# Patient Record
Sex: Female | Born: 1975 | Race: White | Hispanic: No | Smoking: Former smoker
Health system: Southern US, Community
[De-identification: ages and names within clinical notes are randomized; demographics above are authoritative.]

## PROBLEM LIST (undated history)

## (undated) ENCOUNTER — Inpatient Hospital Stay (HOSPITAL_COMMUNITY): Payer: Self-pay

## (undated) DIAGNOSIS — E7800 Pure hypercholesterolemia, unspecified: Secondary | ICD-10-CM

## (undated) DIAGNOSIS — G43909 Migraine, unspecified, not intractable, without status migrainosus: Secondary | ICD-10-CM

## (undated) DIAGNOSIS — E559 Vitamin D deficiency, unspecified: Secondary | ICD-10-CM

## (undated) DIAGNOSIS — O009 Unspecified ectopic pregnancy without intrauterine pregnancy: Secondary | ICD-10-CM

## (undated) DIAGNOSIS — R87629 Unspecified abnormal cytological findings in specimens from vagina: Secondary | ICD-10-CM

## (undated) DIAGNOSIS — E663 Overweight: Secondary | ICD-10-CM

## (undated) DIAGNOSIS — F329 Major depressive disorder, single episode, unspecified: Secondary | ICD-10-CM

## (undated) HISTORY — DX: Unspecified abnormal cytological findings in specimens from vagina: R87.629

## (undated) HISTORY — DX: Major depressive disorder, single episode, unspecified: F32.9

## (undated) HISTORY — PX: LEEP: SHX91

## (undated) HISTORY — DX: Migraine, unspecified, not intractable, without status migrainosus: G43.909

## (undated) HISTORY — DX: Unspecified ectopic pregnancy without intrauterine pregnancy: O00.90

## (undated) HISTORY — DX: Pure hypercholesterolemia, unspecified: E78.00

## (undated) HISTORY — PX: WISDOM TOOTH EXTRACTION: SHX21

## (undated) HISTORY — DX: Vitamin D deficiency, unspecified: E55.9

## (undated) HISTORY — DX: Overweight: E66.3

---

## 1998-05-12 DIAGNOSIS — O009 Unspecified ectopic pregnancy without intrauterine pregnancy: Secondary | ICD-10-CM

## 1998-05-12 HISTORY — DX: Unspecified ectopic pregnancy without intrauterine pregnancy: O00.90

## 1998-05-12 HISTORY — PX: LAPAROSCOPY: SHX197

## 2005-05-12 DIAGNOSIS — F32A Depression, unspecified: Secondary | ICD-10-CM

## 2005-05-12 HISTORY — DX: Depression, unspecified: F32.A

## 2005-09-24 ENCOUNTER — Inpatient Hospital Stay: Payer: Self-pay

## 2008-03-29 ENCOUNTER — Encounter: Admission: RE | Admit: 2008-03-29 | Discharge: 2008-03-29 | Payer: Self-pay | Admitting: Family Medicine

## 2009-07-13 ENCOUNTER — Ambulatory Visit (HOSPITAL_COMMUNITY): Admission: RE | Admit: 2009-07-13 | Discharge: 2009-07-13 | Payer: Self-pay | Admitting: Obstetrics and Gynecology

## 2010-01-27 ENCOUNTER — Inpatient Hospital Stay (HOSPITAL_COMMUNITY): Admission: AD | Admit: 2010-01-27 | Discharge: 2010-01-27 | Payer: Self-pay | Admitting: Obstetrics & Gynecology

## 2010-01-27 ENCOUNTER — Ambulatory Visit: Payer: Self-pay | Admitting: Nurse Practitioner

## 2010-02-27 ENCOUNTER — Inpatient Hospital Stay (HOSPITAL_COMMUNITY): Admission: AD | Admit: 2010-02-27 | Discharge: 2010-03-02 | Payer: Self-pay | Admitting: Obstetrics and Gynecology

## 2010-07-24 LAB — GLUCOSE, CAPILLARY: Glucose-Capillary: 50 mg/dL — ABNORMAL LOW (ref 70–99)

## 2010-07-24 LAB — CBC
HCT: 34.8 % — ABNORMAL LOW (ref 36.0–46.0)
Hemoglobin: 11.1 g/dL — ABNORMAL LOW (ref 12.0–15.0)
Hemoglobin: 12 g/dL (ref 12.0–15.0)
MCV: 94.5 fL (ref 78.0–100.0)
Platelets: 157 10*3/uL (ref 150–400)

## 2010-07-25 LAB — URINALYSIS, ROUTINE W REFLEX MICROSCOPIC
Bilirubin Urine: NEGATIVE
Glucose, UA: NEGATIVE mg/dL
Ketones, ur: NEGATIVE mg/dL
Protein, ur: NEGATIVE mg/dL
pH: 5.5 (ref 5.0–8.0)

## 2011-04-22 ENCOUNTER — Encounter: Payer: Self-pay | Admitting: *Deleted

## 2011-04-22 ENCOUNTER — Other Ambulatory Visit: Payer: Self-pay

## 2011-04-22 ENCOUNTER — Emergency Department (HOSPITAL_COMMUNITY)
Admission: EM | Admit: 2011-04-22 | Discharge: 2011-04-22 | Disposition: A | Discharge status: Home / Self Care | Payer: BC Managed Care – PPO | Attending: Emergency Medicine | Admitting: Emergency Medicine

## 2011-04-22 DIAGNOSIS — Z79899 Other long term (current) drug therapy: Secondary | ICD-10-CM | POA: Insufficient documentation

## 2011-04-22 DIAGNOSIS — R4182 Altered mental status, unspecified: Secondary | ICD-10-CM | POA: Insufficient documentation

## 2011-04-22 DIAGNOSIS — F3289 Other specified depressive episodes: Secondary | ICD-10-CM | POA: Insufficient documentation

## 2011-04-22 DIAGNOSIS — T50905A Adverse effect of unspecified drugs, medicaments and biological substances, initial encounter: Secondary | ICD-10-CM

## 2011-04-22 DIAGNOSIS — F329 Major depressive disorder, single episode, unspecified: Secondary | ICD-10-CM | POA: Insufficient documentation

## 2011-04-22 DIAGNOSIS — T483X5A Adverse effect of antitussives, initial encounter: Secondary | ICD-10-CM | POA: Insufficient documentation

## 2011-04-22 DIAGNOSIS — R42 Dizziness and giddiness: Secondary | ICD-10-CM | POA: Insufficient documentation

## 2011-04-22 LAB — POCT I-STAT, CHEM 8
BUN: 18 mg/dL (ref 6–23)
Creatinine, Ser: 0.6 mg/dL (ref 0.50–1.10)
Hemoglobin: 12.6 g/dL (ref 12.0–15.0)
TCO2: 25 mmol/L (ref 0–100)

## 2011-04-22 NOTE — ED Provider Notes (Signed)
I saw and evaluated the patient, reviewed the resident's note and I agree with the findings and plan. 35 year old, female, with no significant past medical history presents emergency department complaining of a vague feeling of visual changes, and inability to use her left arm, while she was driving.  The symptoms lasted for about 30 minutes and have resolved.  She denied blurriness of her vision.  She denied nausea, vomiting, headache, weakness, palpitations of her heart or chest pain.  She's never had this before.  She states that she is taking Delsym for a URI.  Right.  Now.  She also takes Cymbalta.  She does not smoke and has not had any history of coronary disease or neurological disease in the past.  Her physical examination is completely normal.  At this time.  There is no indication for CAT scan or an MRI of her head.  We will let her go home with her husband.  She and her husband are both comfortable with this plan.  Nicholes Stairs, MD 04/22/11 1949

## 2011-04-22 NOTE — ED Provider Notes (Signed)
History     CSN: [id number] Arrival date & time: 04/22/2011  3:46 PM   First MD Initiated Contact with Patient 04/22/11 1614      Chief Complaint  Patient presents with  . Altered Mental Status    (Consider location/radiation/quality/duration/timing/severity/associated sxs/prior treatment) The history is provided by the patient.   Patient is a 78 seminal female with history of migraine and postpartum depression who presents with altered mental status. This started about an hour prior to arrival, lasted 30 minutes, was constant, and resolved prior to arrival. Patient states that she was driving when she had this feeling of her head being "full" and having trouble communicating from her head to her arms and legs. During this time she still had normal strength and sensation in all extremities. She was also able to drive and pull off the road.  When she stopped the car, she got out and as she stood up she felt lightheaded. No syncope. Lightheadedness resolved when she sat down. Patient denies headache, vision changes, eye pain, vision loss, dizziness, palpitations, chest pain, dyspnea, abdominal discomfort, dysuria. She is on her menstrual cycle at this time but is not changed in quality. She has not had this same problem before. She describes her migraines as typically left-sided headache and after her menstrual cycle. Patient did take dextromethorphan about an hour and half prior to this complaint. She took this for a mild URI symptoms she's had for the last week. No recorded fever.       Past Medical History  Diagnosis Date  . Depressed     No past surgical history on file.  No family history on file.  History  Substance Use Topics  . Smoking status: Not on file  . Smokeless tobacco: Not on file  . Alcohol Use:     OB History    Grav Para Term Preterm Abortions TAB SAB Ect Mult Living                  Review of Systems  Constitutional: Negative for fever and chills.    HENT: Negative for ear pain, facial swelling and neck stiffness.        No neck pain  Eyes: Negative for visual disturbance.  Respiratory: Negative for cough, chest tightness, shortness of breath and wheezing.   Cardiovascular: Negative for chest pain.  Gastrointestinal: Negative for nausea, vomiting, abdominal pain and diarrhea.  Genitourinary: Negative for difficulty urinating.  Skin: Negative for rash.  Neurological: Negative for seizures, syncope, speech difficulty, weakness and numbness.  Psychiatric/Behavioral: Negative for behavioral problems and confusion.       Mood has been good, no depression  All other systems reviewed and are negative.    Allergies  Penicillins and Sulfa antibiotics  Home Medications   Current Outpatient Rx  Name Route Sig Dispense Refill  . DULOXETINE HCL 60 MG PO CPEP Oral Take 60 mg by mouth daily.        BP 104/69  Pulse 84  Temp(Src) 97.4 F (36.3 C) (Oral)  Resp 16  SpO2 99%  Physical Exam  Nursing note and vitals reviewed. Constitutional: She is oriented to person, place, and time. She appears well-developed and well-nourished. No distress.  HENT:  Head: Normocephalic.  Mouth/Throat: Oropharynx is clear and moist.  Eyes: EOM are normal. Pupils are equal, round, and reactive to light. No scleral icterus.  Neck: Normal range of motion. Neck supple.       No bruits  Cardiovascular: Normal rate, regular  rhythm and intact distal pulses.   Pulmonary/Chest: Effort normal. No respiratory distress. She has no wheezes. She has no rales.  Abdominal: Soft. She exhibits no distension. There is no tenderness.  Musculoskeletal: Normal range of motion. She exhibits no edema and no tenderness.  Neurological: She is alert and oriented to person, place, and time. She has normal strength. No cranial nerve deficit or sensory deficit. She exhibits normal muscle tone. She displays a negative Romberg sign. Coordination and gait normal. GCS eye subscore is  4. GCS verbal subscore is 5. GCS motor subscore is 6.       No pronator drift  Skin: Skin is warm and dry. No rash noted. She is not diaphoretic.  Psychiatric: She has a normal mood and affect. Her behavior is normal. Thought content normal.    ED Course  Procedures (including critical care time)   Labs Reviewed  POCT I-STAT, CHEM 8  POCT PREGNANCY, URINE   No results found.   1. Adverse effects of medication       MDM   Patient here with transient altered mental status. It was resolved upon arrival to emergency department. She had no focal neurologic deficits during this time. Also no syncope or seizure-like activity. Patient has history of migraines though they're typically very different than this. Based on her new taking of dextromethorphan, patient's complaints most likely secondary to adverse effect of medication. In addition to her they confusion she had dilated pupils on arrival which improved as her symptoms improve. Full neurologic exam was unremarkable. Electrolytes also unremarkable. Nothing in history or exam to suggest emergent intracranial etiology.  Patient does take Cymbalta but there are no vital sign or clinical features that would raise concern for serotonin syndrome. Patient has good followup and return precautions were discussed.       Milus Glazier 04/23/11 0052  Milus Glazier 04/23/11 [phone]

## 2011-04-22 NOTE — ED Notes (Signed)
Per EMS pt from home with c/o confusion, visual disturbances. No facial droop, grips equal, no slurred speech. Pt alert and oriented. Unsteady on feet. Pt c/o pressure in head PTA, denies pain now.

## 2011-04-22 NOTE — ED Notes (Signed)
Pt states she is currently feeling normal.  The episode of vision disturbance and feeling odd/weak came on gradually and went away gradually.  Pt didn't feel like she was passing out, but felt strange.  Episode lasted approximately 30 minutes

## 2011-04-22 NOTE — ED Notes (Signed)
Pt states she was driving and muscles would not respond to her thoughts.  States her breathing and pulse were normal but thoughts and actions werent together.  Pt states she had pressure in her head and pulled her car over. No pain now and no anxiety

## 2011-04-23 NOTE — ED Provider Notes (Signed)
I saw and evaluated the patient, reviewed the resident's note and I agree with the findings and plan.  Nicholes Stairs, MD 04/23/11 [phone]

## 2012-06-16 ENCOUNTER — Encounter: Payer: Self-pay | Admitting: Family Medicine

## 2012-06-16 ENCOUNTER — Ambulatory Visit (INDEPENDENT_AMBULATORY_CARE_PROVIDER_SITE_OTHER): Payer: BC Managed Care – PPO | Admitting: Family Medicine

## 2012-06-16 VITALS — BP 110/72 | HR 88 | Ht 64.0 in | Wt 155.0 lb

## 2012-06-16 DIAGNOSIS — F3289 Other specified depressive episodes: Secondary | ICD-10-CM

## 2012-06-16 DIAGNOSIS — Z6825 Body mass index (BMI) 25.0-25.9, adult: Secondary | ICD-10-CM

## 2012-06-16 DIAGNOSIS — F331 Major depressive disorder, recurrent, moderate: Secondary | ICD-10-CM | POA: Insufficient documentation

## 2012-06-16 DIAGNOSIS — F329 Major depressive disorder, single episode, unspecified: Secondary | ICD-10-CM

## 2012-06-16 DIAGNOSIS — E663 Overweight: Secondary | ICD-10-CM

## 2012-06-16 MED ORDER — DULOXETINE HCL 60 MG PO CPEP: 60.0000 mg | ORAL_CAPSULE | Freq: Every day | ORAL | Status: DC

## 2012-06-16 MED ORDER — PHENTERMINE HCL 37.5 MG PO CAPS: 37.5000 mg | ORAL_CAPSULE | ORAL | Status: DC

## 2012-06-16 NOTE — Progress Notes (Signed)
Chief Complaint  Patient presents with  . Establish Care    new patient to establish care and get a refill om Cymbalta. Also would like to discuss her weight.   HPI:  Megan Butler is a 37 year old female, well known to me (former colleague, never her physician) who presents to establish care.  She is requesting refills of Cymbalta, and requesting trial of Phentermine.    Depression: She was initially diagnosed with postpartum depression after 2nd trial.  Went off meds when pregnant with her third, and restarted meds shortly after his birth.  She went off for a short bit, and irritability recurred, so went back on it.  Denies side effects.  She initially had anorgasmia, no issues now.  Also took Lexapro in past (initially), caused significant fatigue.  She is also here to discuss her weight, requesting Phentermine.  She is a Artist, and has been keeping journal, food log.  She reports that her weight was 130's last year this time, has recently gained weight. "I have no control over my cravings".  She is having trouble in the afternoons, usually when she is out and about. Starts out as hunger, but then can't stop--realizes she is continuing to eat even when not hungry.  She denies any thyroid-related symptoms.  She was thinking of 1200kcal goal, and has been trying this recently.  She reports that when she is eating 1600-1700 cals, she does fine, less hungry, but is concerned that she won't lose the weight.  She has a FitBit, and gets 10K steps daily, elliptical 30 min 3-4 days/week.  She cut out drinking coffee, using artificial sweeteners  Past Medical History  Diagnosis Date  . Depression 2007    tx'd x 6 months postpartum, recurred in 2009,  . Vitamin D deficiency   . Ectopic pregnancy 2000    and peritonitis--s/p surgery   Past Surgical History  Procedure Date  . Laparoscopy 2000    peritonitis, ectopic pregnancy  . Wisdom tooth extraction    History   Social History  .  Marital Status: Married    Spouse Name: N/A    Number of Children: 3  . 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 Smoking status: Former Smoker    Quit date: 05/12/1998  . Smokeless tobacco: Never Used  . Alcohol Use: Yes     Comment: 0-1 per year.  . Drug Use: No  . Sexually Active: Yes -- Female partner(s)    Birth Control/ Protection: Condom   Other Topics Concern  . Not on file   Social History Narrative   Lives with husband Megan Butler, 1 daughter, 2 sons, 1 dog   Family History  Problem Relation Age of Onset  . Adopted: Yes  . Family history unknown: Yes   Meds: Cymbalta 60mg  daily Vitamin D3 sporadically  Allergies  Allergen Reactions  . Penicillins Other (See Comments)    Syncope.  . Sulfa Antibiotics Rash   ROS: denies fevers, URI symptoms, headaches, dizziness, chest pain, shortness of breath, nausea, vomiting, bowel changes, joint pains, bleeding, bruising, skin rash, GI complaints, GU complaints.  Denies palpitations, insomnia.  See HPI  PHYSICAL EXAM: BP 110/72  Pulse 88  Ht 5\' 4"  (1.626 m)  Wt 155 lb (70.308 kg)  BMI 26.61 kg/m2  LMP 06/15/2012 Well developed, pleasant female in no distress HEENT:  PERRL, conjunctiva clear Neck: no lymphadenopathy or thyromegaly  Heart: regular rate and rhythm without murmur Lungs: clear bilaterally Back: no spine or CVA tenderness Abdomen: soft, nontender, no organomegaly or mass Extremities: no edema, 2+ pulse Neuro: alert and oriented, cranial nerves grossly intact.  Normal strength, sensation, gait Psych: normal mood, affect, hygiene and grooming.  Seems frustrated with her lack of control/ability to stop eating when hungry in afternoons.  ASSESSMENT/PLAN: 1. Depressive disorder, not elsewhere classified  DULoxetine (CYMBALTA) 60 MG capsule  2. Overweight (BMI 25.0-29.9)  phentermine 37.5 MG capsule   Depression--well controlled.  Continue  Cymbalta, can change to generic now that she no longer can get samples from her office.  Consider tapering dose back when stressors are minimal (no rush).  Overweight.  Technically doesn't meet criteria for obesity for phentermine.  However, she really just needs a little jump start with the weight loss.  Plan short-term use (2-3 months).  Counseled for more than 1/2 visit regarding techniques to avoid the mid-afternoon hunger, and things to try to get under control (if the phentermine isn't effective, or for once she stops taking it).  Continue journaling, exercise.  Discussed that 1200kcal goal likely isn't realistic, may contribute to hunger.  Shoot for 1500 cals/day instead.  Increase exercise some, if able.  Reviewed risks and side effects.  She will check BP at work, and call in 4-6 weeks with weight and BP.  30 minute visit, more than 1/2 spent counseling.

## 2012-06-16 NOTE — Patient Instructions (Addendum)
Check BP at work, and call in 4-6 weeks with weight and BP (you can text me).

## 2012-10-11 ENCOUNTER — Other Ambulatory Visit: Payer: Self-pay | Admitting: Family Medicine

## 2012-10-11 NOTE — Telephone Encounter (Signed)
Is this okay to refill? 

## 2012-10-15 NOTE — Telephone Encounter (Signed)
Pt reports that med helped her be able to cut portions and cravings.  Megan Butler gained weight back (back to 152) after moving out of house, while living in hotel and eating more fast food.  She is now in an apartment, and will get back into an exercise routine.  Pt will keep me updated.  Short term use only intended

## 2012-10-25 ENCOUNTER — Ambulatory Visit (INDEPENDENT_AMBULATORY_CARE_PROVIDER_SITE_OTHER): Payer: BC Managed Care – PPO | Admitting: Licensed Clinical Social Worker

## 2012-10-25 DIAGNOSIS — IMO0002 Reserved for concepts with insufficient information to code with codable children: Secondary | ICD-10-CM

## 2012-11-11 ENCOUNTER — Ambulatory Visit (INDEPENDENT_AMBULATORY_CARE_PROVIDER_SITE_OTHER): Payer: BC Managed Care – PPO | Admitting: Licensed Clinical Social Worker

## 2012-11-11 DIAGNOSIS — IMO0002 Reserved for concepts with insufficient information to code with codable children: Secondary | ICD-10-CM

## 2012-11-25 ENCOUNTER — Ambulatory Visit (INDEPENDENT_AMBULATORY_CARE_PROVIDER_SITE_OTHER): Payer: BC Managed Care – PPO | Admitting: Licensed Clinical Social Worker

## 2012-11-25 DIAGNOSIS — IMO0002 Reserved for concepts with insufficient information to code with codable children: Secondary | ICD-10-CM

## 2012-12-07 ENCOUNTER — Ambulatory Visit (INDEPENDENT_AMBULATORY_CARE_PROVIDER_SITE_OTHER): Payer: BC Managed Care – PPO | Admitting: Licensed Clinical Social Worker

## 2012-12-07 DIAGNOSIS — IMO0002 Reserved for concepts with insufficient information to code with codable children: Secondary | ICD-10-CM

## 2012-12-23 ENCOUNTER — Ambulatory Visit (INDEPENDENT_AMBULATORY_CARE_PROVIDER_SITE_OTHER): Payer: BC Managed Care – PPO | Admitting: Licensed Clinical Social Worker

## 2012-12-23 DIAGNOSIS — IMO0002 Reserved for concepts with insufficient information to code with codable children: Secondary | ICD-10-CM

## 2013-01-04 ENCOUNTER — Ambulatory Visit (INDEPENDENT_AMBULATORY_CARE_PROVIDER_SITE_OTHER): Payer: BC Managed Care – PPO | Admitting: Licensed Clinical Social Worker

## 2013-01-04 DIAGNOSIS — IMO0002 Reserved for concepts with insufficient information to code with codable children: Secondary | ICD-10-CM

## 2013-01-18 ENCOUNTER — Ambulatory Visit (INDEPENDENT_AMBULATORY_CARE_PROVIDER_SITE_OTHER): Payer: BC Managed Care – PPO | Admitting: Licensed Clinical Social Worker

## 2013-01-18 DIAGNOSIS — IMO0002 Reserved for concepts with insufficient information to code with codable children: Secondary | ICD-10-CM

## 2013-02-01 ENCOUNTER — Ambulatory Visit: Payer: BC Managed Care – PPO | Admitting: Licensed Clinical Social Worker

## 2013-03-09 ENCOUNTER — Ambulatory Visit (INDEPENDENT_AMBULATORY_CARE_PROVIDER_SITE_OTHER): Payer: BC Managed Care – PPO | Admitting: Family Medicine

## 2013-03-09 ENCOUNTER — Encounter: Payer: Self-pay | Admitting: Family Medicine

## 2013-03-09 VITALS — BP 118/76 | HR 72 | Ht 64.25 in | Wt 175.0 lb

## 2013-03-09 DIAGNOSIS — E663 Overweight: Secondary | ICD-10-CM

## 2013-03-09 DIAGNOSIS — F329 Major depressive disorder, single episode, unspecified: Secondary | ICD-10-CM

## 2013-03-09 DIAGNOSIS — Z2089 Contact with and (suspected) exposure to other communicable diseases: Secondary | ICD-10-CM

## 2013-03-09 DIAGNOSIS — Z6825 Body mass index (BMI) 25.0-25.9, adult: Secondary | ICD-10-CM

## 2013-03-09 DIAGNOSIS — Z202 Contact with and (suspected) exposure to infections with a predominantly sexual mode of transmission: Secondary | ICD-10-CM

## 2013-03-09 DIAGNOSIS — F3289 Other specified depressive episodes: Secondary | ICD-10-CM

## 2013-03-09 MED ORDER — PHENTERMINE HCL 37.5 MG PO CAPS: ORAL_CAPSULE | ORAL | Status: DC

## 2013-03-09 NOTE — Patient Instructions (Signed)
Please use condoms. Diet and exercise as you know you should!  Consider Qsymia if having recurrent problems with weight gain after stopping the phentermine (especially if compliant with diet and exercise).

## 2013-03-09 NOTE — Progress Notes (Signed)
Chief Complaint  Patient presents with  . STD check    as she has a new partner. Also would like to discuss phentermine if she has time.    Patient is in a new sexual relationship (first since separating from her husband). She has Mirena IUD, and hasn't been using condoms.   IUD was put in about 3 months ago and is still having some spotting. Unprotected sex since early Ragland the same partner, felt to be monogamous. She wants STD check, does not have any symptoms of infection--no vaginal discharge, odor, itch, fevers, pelvic pain or urinary complaints. Denies any sores, lesions or concerns.  Her husband had h/o HSV-2.  She tested negative during one of her pregnancies, and would like to be tested again (as screening, to ensure that she never contracted from husband, to not worry about passing to partner)  Depression:  Well controlled.  Her separation/divorce proceedings have been messy, and they are in the midst of getting psych evals for the family (per husband's request, who notably was fired by his attorney for not listening to his advice/recs).  She is compliant with the Cymbalta.  Overweight/borderline obesity:  She has been out of the phentermine.  When she first started it, she was good with her diet, but when living in the apartment, wasn't able to exercise (had the youngest son with her, couldn't get to the gym).  She hasn't been as good with her diet recently (and towards the end of her last rx for phentermine)--eating what the kids were eating, sometimes not eating if/when not hungry.  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  She is now living back in the house, and has her elliptical available.  She also has a new puppy, and recently is also hosting its sibling.  She gained 20 pounds since her last visit.  She got a Groupon for [employer]ician's weight loss where they check her food journal and weight 3x/week. She feels this will make  her more accountable to diet/exercise.   Past Medical History  Diagnosis Date  . Depression 2007    tx'd x 6 months postpartum, recurred in 2009,  . Vitamin D deficiency   . Ectopic pregnancy 2000    and peritonitis--s/p surgery   Past Surgical History  Procedure Laterality Date  . Laparoscopy  2000    peritonitis, ectopic pregnancy  . Wisdom tooth extraction     History   Social History  . Marital Status: Married    Spouse Name: N/A    Number of Children: 3  . 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 Smoking status: Former Smoker    Quit date: 05/12/1998  . Smokeless tobacco: Never Used  . Alcohol Use: Yes     Comment: 1-2 drinks per week.   . Drug Use: No  . Sexual Activity: Yes    Partners: Male    Birth Control/ Protection: IUD     Comment: mirena   Other Topics Concern  . Not on file   Social History Narrative   Separated from her husband Fayrene Fearing in 2014.  Currently in courts re: custody, currently shared.  Living at house with 1 daughter, 2 sons, 1 dog (husky puppy 02/2013).  Works at the [employer] on site for [employer] [place]   Current Outpatient Prescriptions on  File Prior to Visit  Medication Sig Dispense Refill  . Cholecalciferol (VITAMIN D3) 1000 UNITS CHEW Chew 1 tablet by mouth daily.      . DULoxetine (CYMBALTA) 60 MG capsule Take 1 capsule (60 mg total) by mouth daily.  90 capsule  3  . ibuprofen (ADVIL,MOTRIN) 200 MG tablet Take 600-800 mg by mouth every 6 (six) hours as needed.       No current facility-administered medications on file prior to visit.   Allergies  Allergen Reactions  . Penicillins Other (See Comments)    Syncope.  . Sulfa Antibiotics Rash   ROS:  Denies fevers, chills, nausea, vomiting, diarrhea, abdominal pain, urinary complaints, bruising, rash.  +vaginal spotting since Mirena IUD.  No headaches, dizziness, chest pain.  Moods are well  controlled.  PHYSICAL EXAM: BP 118/76  Pulse 72  Ht 5' 4.25" (1.632 m)  Wt 175 lb (79.379 kg)  BMI 29.8 kg/m2 Well developed, pleasant female in no distress Normal mood, affect, hygiene and grooming Remainder of visit was limited to discussion/counseling.  ASSESSMENT/PLA:  Exposure to STD - unprotected intercourse; asymptomatic - Plan: GC/Chlamydia Probe Amp, HIV antibody, RPR, HSV 2 antibody, IgG  Overweight (BMI 25.0-29.9) - 20 pound weight gain since 06/2012 - Plan: phentermine 37.5 MG capsule  Depressive disorder, not elsewhere classified - controlled   HIV, RPR, GC/chlamydia HSV-2 IgG   Phentermine refilled x 2 months.  Discussed need for compliance with diet, exercise.  Counseled re: risks and side effects. Consider Qsymia if pattern of weight gain recurs after stopping med, can be used for maintenance

## 2013-03-10 LAB — HSV 2 ANTIBODY, IGG: HSV 2 Glycoprotein G Ab, IgG: <0.1 IV

## 2013-03-10 LAB — GC/CHLAMYDIA PROBE AMP: GC Probe RNA: NEGATIVE

## 2013-06-09 ENCOUNTER — Telehealth: Payer: Self-pay | Admitting: Family Medicine

## 2013-06-09 DIAGNOSIS — E663 Overweight: Secondary | ICD-10-CM

## 2013-06-09 MED ORDER — PHENTERMINE HCL 37.5 MG PO CAPS: ORAL_CAPSULE | ORAL | Status: DC

## 2013-06-09 NOTE — Telephone Encounter (Signed)
Patient has lost 10 pounds so far, at 164, and has been maintaining that weight off phentermine.  Would like refill for another month.  Walking dogs, using elliptical (going through divorce, just moved, doing well overall).

## 2013-09-22 ENCOUNTER — Ambulatory Visit (INDEPENDENT_AMBULATORY_CARE_PROVIDER_SITE_OTHER): Payer: BC Managed Care – PPO | Admitting: Family Medicine

## 2013-09-22 ENCOUNTER — Encounter: Payer: Self-pay | Admitting: Family Medicine

## 2013-09-22 VITALS — BP 128/78 | HR 87 | Ht 64.5 in | Wt 182.0 lb

## 2013-09-22 DIAGNOSIS — IMO0002 Reserved for concepts with insufficient information to code with codable children: Secondary | ICD-10-CM

## 2013-09-22 DIAGNOSIS — F329 Major depressive disorder, single episode, unspecified: Secondary | ICD-10-CM

## 2013-09-22 DIAGNOSIS — F3289 Other specified depressive episodes: Secondary | ICD-10-CM

## 2013-09-22 DIAGNOSIS — E663 Overweight: Secondary | ICD-10-CM

## 2013-09-22 DIAGNOSIS — F325 Major depressive disorder, single episode, in full remission: Secondary | ICD-10-CM

## 2013-09-22 MED ORDER — PHENTERMINE HCL 37.5 MG PO CAPS: ORAL_CAPSULE | ORAL | Status: DC

## 2013-09-22 MED ORDER — DULOXETINE HCL 60 MG PO CPEP: 60.0000 mg | ORAL_CAPSULE | Freq: Every day | ORAL | Status: DC

## 2013-09-22 NOTE — Progress Notes (Signed)
Chief Complaint  Patient presents with  . Med check    for cymbalta but would like to discuss phentermine.    Depression:  cymbalta is working well, she denies side effects.  She continues to have stress related to her husband.  Mediation is to start in early June.  He continues to be difficult with things that could be simple, related to the care of the children.  They are both in new relationships.  She is still seeing Megan Butler, and things are going well.  She got an MA at work, and that has been very helpful.  Unfortunately, she has regained the weight that she had lost.  She lost last bottle of phentermine she picked up (somewhere in her house). She took it only for a couple of days before losing it.  Last rx was 1/29 with a refill, so likely that was early in March.  Her weight was stable in the 160's for a while, but went back up again.  She has the kids every other week, and so her routine is very different, depending on the week.  Walking with dog frequently.  She uses the elliptical when kids aren't with her, otherwse playing with them outside, including bike rides. As far as her diet, although she hasn't been tracking her food, she feels like she has been eating well.  Denies being overly restrictive, isn't eating out much.  She drinks 3/4 bottle 2x/week, every other week.  She went back to FitBit (got [employer]) because no one else on [employer] with her--finds it motivating to have friends on it with her.  She had labs done at work (chol 201, HDL >70, normal thyroid studies)  Past Medical History  Diagnosis Date  . Depression 2007    tx'd x 6 months postpartum, recurred in 2009,  . Vitamin D deficiency   . Ectopic pregnancy 2000    and peritonitis--s/p surgery   Past Surgical History  Procedure Laterality Date  . Laparoscopy  2000    peritonitis, ectopic pregnancy  . Wisdom tooth extraction     History   Social History  . Marital Status: Married    Spouse Name: N/A    Number of  Children: 3  . 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 Smoking status: Former Smoker    Quit date: 05/12/1998  . Smokeless tobacco: Never Used  . Alcohol Use: Yes     Comment: 1-2 drinks per week.   . Drug Use: No  . Sexual Activity: Yes    Partners: Male    Birth Control/ Protection: IUD     Comment: mirena   Other Topics Concern  . Not on file   Social History Narrative   Separated from her husband Megan Butler in 2014.  Currently in courts re: custody, currently shared.  Living at house with 1 daughter, 2 sons, 1 dog (husky puppy 02/2013).  Works at the [employer] on site for [employer] [place]   Current Outpatient Prescriptions on File Prior to Visit  Medication Sig Dispense Refill  . Cholecalciferol (VITAMIN D3) 1000 UNITS CHEW Chew 1 tablet by mouth daily.      Marland Kitchen levonorgestrel (MIRENA) 20 MCG/24HR IUD 1 each by Intrauterine route once.      Marland Kitchen ibuprofen (ADVIL,MOTRIN) 200 MG tablet Take 600-800 mg by mouth every 6 (six) hours as needed.  No current facility-administered medications on file prior to visit.   cymbalta 75m/d  Allergies  Allergen Reactions  . Penicillins Other (See Comments)    Syncope.  . Sulfa Antibiotics Rash   ROS:  Denies fevers, chills, allergies, URI symptoms, chest pain, palpitations, GI complaints, GU complaints, skin concerns.  Moods stable. No headaches, dizziness or other problems  PHYSICAL EXAM: BP 128/78  Pulse 87  Ht 5' 4.5" (1.638 m)  Wt 182 lb (82.555 kg)  BMI 30.77 kg/m2 Well developed, pleasant, obese female in no distress HEENT:  PERRL, EOMI, conjunctiva clear Neck: no lymphadenopathy, thyromegaly or mass Heart: regular rate and rhythm without murmur Lungs: clear bilaterally Abdomen: soft, nontender, no organomegaly or mass Extremities: no edema Skin: no rash Psych: normal mood, affect, hygiene and grooming Neuro: alert nad oriented, normal  strength, gait, cranial nerves  ASSESSMENT/PLAN:  Depressive disorder, not elsewhere classified - Plan: DULoxetine (CYMBALTA) 60 MG capsule  Overweight (BMI 25.0-29.9) - 20 pound weight gain since 06/2012; up even more, now with BMI>30.  counseled re: diet, exercise, risks/side effects of phentermine - Plan: phentermine 37.5 MG capsule  Depression, major, in remission  Recommended restart use of MyFitnessPal or other journaling.  Increase exercise; limit portions.  Discussed other meds, specifically Qsymia, which can have a longer-term use (for maintenance).  Her insurance has high deductible, so med will not be affordable for her.  Reviewed indications for use of phentermine (short term, weight goals, etc) and hope to not see weight up and down when goals reached and med stopped.  F/u 1 year, sooner prn To [facility] here [facility]  25 min visit, more than 1/2 spent counseling

## 2013-12-22 ENCOUNTER — Other Ambulatory Visit: Payer: Self-pay | Admitting: Nurse Practitioner

## 2013-12-22 ENCOUNTER — Other Ambulatory Visit (HOSPITAL_COMMUNITY)
Admission: RE | Admit: 2013-12-22 | Discharge: 2013-12-22 | Disposition: A | Discharge status: Home / Self Care | Payer: BC Managed Care – PPO | Source: Ambulatory Visit | Attending: Nurse Practitioner | Admitting: Nurse Practitioner

## 2013-12-22 DIAGNOSIS — Z124 Encounter for screening for malignant neoplasm of cervix: Secondary | ICD-10-CM | POA: Insufficient documentation

## 2013-12-22 DIAGNOSIS — Z1151 Encounter for screening for human papillomavirus (HPV): Secondary | ICD-10-CM | POA: Insufficient documentation

## 2013-12-27 LAB — CYTOLOGY - PAP

## 2014-01-26 ENCOUNTER — Encounter: Payer: Self-pay | Admitting: Family Medicine

## 2014-01-26 ENCOUNTER — Ambulatory Visit (INDEPENDENT_AMBULATORY_CARE_PROVIDER_SITE_OTHER): Payer: BC Managed Care – PPO | Admitting: Family Medicine

## 2014-01-26 ENCOUNTER — Telehealth: Payer: Self-pay | Admitting: Family Medicine

## 2014-01-26 VITALS — BP 120/74 | HR 80 | Ht 64.5 in | Wt 174.0 lb

## 2014-01-26 DIAGNOSIS — Z7189 Other specified counseling: Secondary | ICD-10-CM

## 2014-01-26 DIAGNOSIS — G43829 Menstrual migraine, not intractable, without status migrainosus: Secondary | ICD-10-CM

## 2014-01-26 DIAGNOSIS — E663 Overweight: Secondary | ICD-10-CM

## 2014-01-26 DIAGNOSIS — Z630 Problems in relationship with spouse or partner: Secondary | ICD-10-CM

## 2014-01-26 MED ORDER — ISOMETHEPTENE-APAP-DICHLORAL 65-325-100 MG PO CAPS: 1.0000 | ORAL_CAPSULE | Freq: Four times a day (QID) | ORAL | Status: DC | PRN

## 2014-01-26 MED ORDER — TOPIRAMATE 25 MG PO TABS: ORAL_TABLET | ORAL | Status: DC

## 2014-01-26 NOTE — Patient Instructions (Signed)
Start topamax at 25 mg at bedtime.  After a week increase to 40m.  If needed, add in 296min the morning, titrating up to 5064mwice daily. Keep headache log. Talk to Kess about headaches--?possibly lower estrogen strength in pills

## 2014-01-26 NOTE — Telephone Encounter (Signed)
Aware of interaction--discussed at visit with patient.  Low dose. Pharmacist advised and will fill.

## 2014-01-26 NOTE — Progress Notes (Signed)
Chief Complaint  Patient presents with  . Migraine    history of migraine. Over the last year the frequency has increased. Had one last night while driving and felt this was unsafe.    She feels like stress (related to divorce) has triggered increasing frequency of migraines.  She has been getting them once every 1-2 weeks, where she needs to go home and go to bed.  She has tried imitrex and relpax  in the distant past, but they caused nausea/vomiting.  Last night she had acute onset of headache.  Typically she has some kind of aura (usually clarity of her vision, rather than any vision loss or deficit).  Last night she had left orbital pain, and perception seemed off in her left eye (depth perception).  Headache and vision changes started at the same time, fairly suddenly.  She took ibuprofen, went for a walk, took vicodin, then took a 2nd one 30 mins later and went to bed.  Felt a little better, but still woke up with migraine--milder than last night, no visual disturbance.  Took 845m ibuprofen.  Slight lingering headache currently.  She had Mirena IUD put in 08/2012.  2.5-3 months ago it was taken out--due to constant spotting, and ?if contributing to headaches.  She was put on OCP's. H/o menstrual migraines, migraines just before and after cycle.  She is taking OCP's continuously.  She ended up having bleeding/cycle 2 weeks ago, had a HA at the end of the bleeding (not during).  Some increase in frequency of headaches since changing to the birth control pill, but dealing with court/mediation, more stress in the same time period.  They went for mediation, but she states that JJeneen Rinkswouldn't agree to anything with mediation; court date is set  She did not have any loss of vision, numbness, tingling, weakness, speech/memory problems or other neuro deficits with her headache last night (just problem with depth perception noted).  Past Medical History  Diagnosis Date  . Depression 2007    tx'd x 6  months postpartum, recurred in 2009,  . Vitamin D deficiency   . Ectopic pregnancy 2000    and peritonitis--s/p surgery  . Migraine     menstrual  . Overweight    Past Surgical History  Procedure Laterality Date  . Laparoscopy  2000    peritonitis, ectopic pregnancy  . Wisdom tooth extraction     History   Social History  . Marital Status: Married    Spouse Name: N/A    Number of Children: 3  . Years of Education: N/A   Occupational History  . 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 Smoking status: Former Smoker    Quit date: 05/12/1998  . Smokeless tobacco: Never Used  . Alcohol Use: Yes     Comment: 1-2 drinks per week.   . Drug Use: No  . Sexual Activity: Yes    Partners: Male    Birth Control/ Protection: Pill     Comment: mirena   Other Topics Concern  . Not on file   Social History Narrative   Separated from her husband JJeneen Rinksin 2014.  Currently in courts re: custody, currently shared.  Living at house with 1 daughter, 2 sons, 1 dog (husky puppy 02/2013).  [place] [employer]   Outpatient Encounter Prescriptions as of 01/26/2014  Medication Sig Note  . Cholecalciferol (VITAMIN D3)  1000 UNITS CHEW Chew 1 tablet by mouth daily. 06/16/2012: Takes sporadically  . DULoxetine (CYMBALTA) 60 MG capsule Take 1 capsule (60 mg total) by mouth daily.   . Hydrocodone-Acetaminophen (VICODIN ES) 7.5-300 MG TABS Take 1-2 tablets by mouth as needed. 01/26/2014: Using old rx, uses rarely, for migraines  . ibuprofen (ADVIL,MOTRIN) 200 MG tablet Take 600-800 mg by mouth every 6 (six) hours as needed.   . norgestimate-ethinyl estradiol (ORTHO-CYCLEN,SPRINTEC,PREVIFEM) 0.25-35 MG-MCG tablet Take 1 tablet by mouth daily.   Marland Kitchen isometheptene-acetaminophen-dichloralphenazone (MIDRIN) 65-325-100 MG capsule Take 1-2 capsules by mouth 4 (four) times daily as needed for migraine. Maximum 5 capsules in 12 hours for migraine  headaches, 8 capsules in 24 hours for tension headaches.   . phentermine 37.5 MG capsule TAKE 1 CAPSULE BY MOUTH EVERY MORNING   . topiramate (TOPAMAX) 25 MG tablet Take 1 tablet at bedtime. After a week increase to 2 tablets qHS.  Can titrate up to 41m BID if needed   . [DISCONTINUED] levonorgestrel (MIRENA) 20 MCG/24HR IUD 1 each by Intrauterine route once. 03/09/2013: Put in summer 2014  (topamax and midrin written today, not prior to visit).  Allergies  Allergen Reactions  . Penicillins Other (See Comments)    Syncope.  . Sulfa Antibiotics Rash   ROS: no fevers, chills, dizziness, syncope, URI or allergy symptoms, cough, shortness of breath, chest pain, bleeding/bruising, rash, GI or GU complaints. See HPI.  PHYSICAL EXAM: BP 120/74  Pulse 80  Ht 5' 4.5" (1.638 m)  Wt 174 lb (78.926 kg)  BMI 29.42 kg/m2  LMP 01/12/2014 Well developed, pleasant female in no distress HEENT:  PERRL, EOMI, conjunctiva clear.  OP clear. Nasal mucosa normal.  Sinuses nontender.  Fundi benign. Neck: no lymphadenopathy, thyromegaly or carotid bruit Heart: regular rate and rhythm, no murmur Lungs: clear bilaterally Abdomen: soft, nontender Extremities: no edema Neuro: alert and oriented. Cranial nerves 2-12 intact. Normal strength, sensation, gait, DTR's 2+ and symmetric. Psych: normal mood, affect, hygiene and grooming. Normal speech, eye contact, judgment  ASSESSMENT/PLAN:  Menstrual migraine without status migrainosus, not intractable - increase in frequency related to stress--?if OCP's contributing.  Discussed risk for CVA with OCP's in pts with migraines. d/w GYN--consider lower dose estrogen - Plan: isometheptene-acetaminophen-dichloralphenazone (MIDRIN) 65-325-100 MG capsule, topiramate (TOPAMAX) 25 MG tablet  Stress due to marital problems - stress reduction.  continue cymbalta  Overweight - topamax might help with weight loss.  if not, call to refill phentermine. continue  diet/exercise  Migraines--intolerant of triptans.  Trial of Midrin (if available). At this point, given frequency, recommend preventative measures. Since having issues with weight (and on phentermine), let's use topamax to help prevent migranes.  Risks/side effects reviewed, including potential interaction with OCP's.  Start at low dose--224mqHS, and increase in a week to 5063mHS.  May further titrate up to 13m36mD if needed.  Keep headache journal.  Pt to call GYN and advise her of worsening migraines and new meds started Discussed potential risks of stroke with OCPs and migraines.   F/u by phone in a month

## 2014-02-08 ENCOUNTER — Other Ambulatory Visit: Payer: Self-pay | Admitting: Family Medicine

## 2014-02-08 DIAGNOSIS — E663 Overweight: Secondary | ICD-10-CM

## 2014-02-08 MED ORDER — PHENTERMINE HCL 37.5 MG PO CAPS: ORAL_CAPSULE | ORAL | Status: DC

## 2014-02-08 NOTE — Telephone Encounter (Signed)
Patient is doing well without migraines.   She is asking for one last month of phentermine (hoping to be able to give one last all or nothing push to get as much weight off as possible before her 88th birthday in November).  Lorraine for #30, no refill.  Please call in as pended.

## 2014-03-27 ENCOUNTER — Telehealth: Payer: Self-pay | Admitting: Family Medicine

## 2014-03-27 NOTE — Telephone Encounter (Signed)
Called pharmacy and spoke with pharmacist and denied.

## 2014-03-27 NOTE — Telephone Encounter (Signed)
Deny (has taken it for 10 months; last request was for a "final" month, prior to her 86th birthday).  This is not supposed to be a chronic/ongoing medication

## 2014-09-21 ENCOUNTER — Other Ambulatory Visit: Payer: Self-pay | Admitting: Family Medicine

## 2014-09-21 DIAGNOSIS — F325 Major depressive disorder, single episode, in full remission: Secondary | ICD-10-CM

## 2014-09-21 DIAGNOSIS — G43829 Menstrual migraine, not intractable, without status migrainosus: Secondary | ICD-10-CM

## 2014-09-22 NOTE — Telephone Encounter (Signed)
I spoke with the patient and made her aware that she needs to schedule an appointment.

## 2014-09-22 NOTE — Telephone Encounter (Signed)
Is this okay to refill? 

## 2014-09-22 NOTE — Telephone Encounter (Signed)
I did the refills, but she needs to schedule a med check at some point between now and September (needs to be seen once yearly in the office for prescriptions).  She is not on MyChart or I would send her a message--please call and let her know to schedule med check at her convenience, but that refills were done for 90 days

## 2014-10-18 ENCOUNTER — Telehealth: Payer: Self-pay | Admitting: Family Medicine

## 2014-10-18 NOTE — Telephone Encounter (Signed)
Pt cancelled appt via call back system. L/m for pt to call to reschedule.

## 2014-10-19 ENCOUNTER — Encounter: Payer: Self-pay | Admitting: Family Medicine

## 2015-01-23 ENCOUNTER — Encounter: Payer: Self-pay | Admitting: Family Medicine

## 2015-01-23 ENCOUNTER — Ambulatory Visit (INDEPENDENT_AMBULATORY_CARE_PROVIDER_SITE_OTHER): Payer: BLUE CROSS/BLUE SHIELD | Admitting: Family Medicine

## 2015-01-23 VITALS — BP 118/78 | HR 68 | Wt 160.2 lb

## 2015-01-23 DIAGNOSIS — G43019 Migraine without aura, intractable, without status migrainosus: Secondary | ICD-10-CM

## 2015-01-23 MED ORDER — ONDANSETRON HCL 4 MG PO TABS: 4.0000 mg | ORAL_TABLET | Freq: Three times a day (TID) | ORAL | Status: DC | PRN

## 2015-01-23 MED ORDER — METHYLPREDNISOLONE SODIUM SUCC 125 MG IJ SOLR
125.0000 mg | Freq: Once | INTRAMUSCULAR | Status: AC
  Administered 2015-01-23: 125 mg via INTRAMUSCULAR

## 2015-01-23 MED ORDER — KETOROLAC TROMETHAMINE 60 MG/2ML IM SOLN
60.0000 mg | Freq: Once | INTRAMUSCULAR | Status: AC
  Administered 2015-01-23: 60 mg via INTRAMUSCULAR

## 2015-01-23 MED ORDER — PREDNISONE 10 MG (21) PO TBPK: ORAL_TABLET | ORAL | Status: DC

## 2015-01-23 MED ORDER — FROVATRIPTAN SUCCINATE 2.5 MG PO TABS: 2.5000 mg | ORAL_TABLET | ORAL | Status: DC | PRN

## 2015-01-23 NOTE — Patient Instructions (Signed)
Today we are trying to break her headache by giving you Toradol IM and Solumedrol. I am prescribing a steroid Dosepak as well as Frova (if needed) and Zofran (Nausea if needed) Please call us tomorrow and let us know how you are doing.   Recurrent Migraine Headache A migraine headache is an intense, throbbing pain on one or both sides of your head. Recurrent migraines keep coming back. A migraine can last for 30 minutes to several hours. CAUSES  The exact cause of a migraine headache is not always known. However, a migraine may be caused when nerves in the brain become irritated and release chemicals that cause inflammation. This causes pain. Certain things may also trigger migraines, such as:   Alcohol.  Smoking.  Stress.  Menstruation.  Aged cheeses.  Foods or drinks that contain nitrates, glutamate, aspartame, or tyramine.  Lack of sleep.  Chocolate.  Caffeine.  Hunger.  Physical exertion.  Fatigue.  Medicines used to treat chest pain (nitroglycerine), birth control pills, estrogen, and some blood pressure medicines. SYMPTOMS   Pain on one or both sides of your head.  Pulsating or throbbing pain.  Severe pain that prevents daily activities.  Pain that is aggravated by any physical activity.  Nausea, vomiting, or both.  Dizziness.  Pain with exposure to bright lights, loud noises, or activity.  General sensitivity to bright lights, loud noises, or smells. Before you get a migraine, you may get warning signs that a migraine is coming (aura). An aura may include:  Seeing flashing lights.  Seeing bright spots, halos, or zigzag lines.  Having tunnel vision or blurred vision.  Having feelings of numbness or tingling.  Having trouble talking.  Having muscle weakness. DIAGNOSIS  A recurrent migraine headache is often diagnosed based on:  Symptoms.  Physical examination.  A CT scan or MRI of your head. These imaging tests cannot diagnose migraines but  can help rule out other causes of headaches.  TREATMENT  Medicines may be given for pain and nausea. Medicines can also be given to help prevent recurrent migraines. HOME CARE INSTRUCTIONS  Only take over-the-counter or prescription medicines for pain or discomfort as directed by your health care provider. The use of long-term narcotics is not recommended.  Lie down in a dark, quiet room when you have a migraine.  Keep a journal to find out what may trigger your migraine headaches. For example, write down:  What you eat and drink.  How much sleep you get.  Any change to your diet or medicines.  Limit alcohol consumption.  Quit smoking if you smoke.  Get 7-9 hours of sleep, or as recommended by your health care provider.  Limit stress.  Keep lights dim if bright lights bother you and make your migraines worse. SEEK MEDICAL CARE IF:   You do not get relief from the medicines given to you.  You have a recurrence of pain.  You have a fever. SEEK IMMEDIATE MEDICAL CARE IF:  Your migraine becomes severe.  You have a stiff neck.  You have loss of vision.  You have muscular weakness or loss of muscle control.  You start losing your balance or have trouble walking.  You feel faint or pass out.  You have severe symptoms that are different from your first symptoms. MAKE SURE YOU:   Understand these instructions.  Will watch your condition.  Will get help right away if you are not doing well or get worse. Document Released: 01/21/2001 Document Revised: 09/12/2013 Document  Reviewed: 01/03/2013 ExitCare Patient Information 2015 [place], [place]. This information is not intended to replace advice given to you by your health care provider. Make sure you discuss any questions you have with your health care provider.

## 2015-01-23 NOTE — Progress Notes (Signed)
   Subjective:    Patient ID: Megan Butler, female    DOB: [birth year], 39 y.o.   MRN: [id number]  HPI She is here for a 5 day history of migraine headache. She reports the headache is over her left eye and feels like a stabbing sensation. She states that headache was preceded 5 days ago with an aura, that she describes as a depth perception issue, and she has had this in the past. She states the headache is similar to her previous migraines except for the fact that this one has not resolved in spite of multiple therapies at home. Reports the migraine onset was at the end of her menstrual cycle. She reports associated photophobia and phonophobia, denies nausea or vomiting. She has tried 800 mg of ibuprofen every 6-8 hours. Also reports trying Midrin and states these 2 medications together gave her some relief. Reports her headaches would return in the evening and be much worse, to the point that she considered going to the ER for treatment. She states she had some leftover Vicodin and tried this as well with some relief.  States she stopped taking OCPs 2 months ago to see if this made a difference with migraine chronicity. She is now having migraines cyclically.  She states she is currently taking Topamax 162m once daily for prevention.   Denies fever, chills, dizziness, numbness, tingling, weakness.  Reviewed allergies, medications, past medical history, social history.   Review of Systems Pertinent positives and negatives in the history of present illness.    Objective:   Physical Exam  Constitutional: She is oriented to person, place, and time. She appears well-developed and well-nourished. No distress.  Eyes: Conjunctivae and EOM are normal. Pupils are equal, round, and reactive to light.  Neck: Normal range of motion. Neck supple.  Lymphadenopathy:    She has no cervical adenopathy.  Neurological: She is alert and oriented to person, place, and time. She has normal strength and normal  reflexes. No cranial nerve deficit or sensory deficit. Gait normal.          Assessment & Plan:  Intractable migraine without aura and without status migrainosus - Plan: predniSONE (STERAPRED UNI-PAK 21 TAB) 10 MG (21) TBPK tablet, methylPREDNISolone sodium succinate (SOLU-MEDROL) 125 mg/2 mL injection 125 mg, ketorolac (TORADOL) injection 60 mg  Discussed patient with Dr. LRedmond Schooland came up with treatment plan. Will attempt to break her headache with an injection of Toradol and Solu-Medrol in the office. Educated that she should not take NSAIDS (Ibuprofen, motrin, etc) for the rest of today. Will also prescribe steroid dose pak to prevent migraine recurrence. Discussed that we will also prescribe Frova in case she continues to have severe headache but told her to hold off until tomorrow on this. She states that triptans in the past have caused nausea so I will prescribe Zofran in case nausea occurs.  Encouraged her to call the office tomorrow and let uKoreaknow how she is feeling.

## 2015-02-13 ENCOUNTER — Encounter: Payer: Self-pay | Admitting: Family Medicine

## 2015-02-13 ENCOUNTER — Ambulatory Visit (INDEPENDENT_AMBULATORY_CARE_PROVIDER_SITE_OTHER): Payer: BLUE CROSS/BLUE SHIELD | Admitting: Family Medicine

## 2015-02-13 VITALS — BP 102/70 | HR 84 | Ht 64.25 in | Wt 156.8 lb

## 2015-02-13 DIAGNOSIS — G43829 Menstrual migraine, not intractable, without status migrainosus: Secondary | ICD-10-CM | POA: Diagnosis not present

## 2015-02-13 DIAGNOSIS — Z630 Problems in relationship with spouse or partner: Secondary | ICD-10-CM

## 2015-02-13 DIAGNOSIS — N943 Premenstrual tension syndrome: Secondary | ICD-10-CM

## 2015-02-13 DIAGNOSIS — F325 Major depressive disorder, single episode, in full remission: Secondary | ICD-10-CM | POA: Diagnosis not present

## 2015-02-13 DIAGNOSIS — Z23 Encounter for immunization: Secondary | ICD-10-CM

## 2015-02-13 DIAGNOSIS — G43909 Migraine, unspecified, not intractable, without status migrainosus: Secondary | ICD-10-CM | POA: Insufficient documentation

## 2015-02-13 MED ORDER — TOPIRAMATE 100 MG PO TABS: 100.0000 mg | ORAL_TABLET | Freq: Every day | ORAL | Status: DC

## 2015-02-13 MED ORDER — DULOXETINE HCL 60 MG PO CPEP: 120.0000 mg | ORAL_CAPSULE | Freq: Every day | ORAL | Status: DC

## 2015-02-13 MED ORDER — HYDROCODONE-ACETAMINOPHEN 5-325 MG PO TABS: 1.0000 | ORAL_TABLET | Freq: Four times a day (QID) | ORAL | Status: DC | PRN

## 2015-02-13 NOTE — Progress Notes (Signed)
Chief Complaint  Patient presents with  . Depression    med check.    Patient presents for follow up on depression and migraines.  With respect to her depression, she needed to cancel a visit here.  In the interim, she saw Dr.Thacker, about 2-3 months ago, for recurrent depressive symptoms--increased irritability, with known upcoming stressors (court dates).  He bumped up her Cymbalta from 18m to 1231mat bedtime.  Tolerating this without any side effects.  She was planning to use short-term until court case was heard, which was supposed to be last week, but is delayed again. Irritability has improved. Lexapro and Celexa made her sleepy (after 2nd child was born)  Financial case resolved, but her ex-husband has not been making the payments that he is supposed to. Custody hearing was postponed until next month.  Migraines: She has been back on the 10010mf topamax for many months without problems.  She initially started it at 14m17mnd when increased the dose had some suicidal thoughts.  She cut back the dose, symptoms improved.  She has been on 100mg3m at least 6 months.  She had Mirena for a year, had bleeding every month, which wasn't acceptable to her, so it was removed.  She changed to continuous OCPs, still had problems spotting daily.  Then changed to monthly cycles--bleeding was regular, but was having bad monthly migraines.  She stopped her OCP's in June.  Headaches now aren't as bad with her cycles as they were with her OCP's.  She was seen by Vickie on 9/13 with Migraine.  She was ultimately treated with steroid injection, taper and Toradol injection. No headaches since that time. She had some vertigo the following morning after being seen.  Midrin takes the edge off of headache only, overall not very effective. Triptans have caused nausea in the past.  She now has rx for Frova and zofran, but hasn't had to use these yet. She is asking for a prescription for hydrocodone to have on  hand, in case these aren't effective (previously had old rx for hydrocodone 7.5mg, 28mlonger has).  PMH, PSH, SH reviewed and updated.  Outpatient Encounter Prescriptions as of 02/13/2015  Medication Sig Note  . DULoxetine (CYMBALTA) 60 MG capsule Take 2 capsules (120 mg total) by mouth at bedtime.   . isomMarland Kitchentheptene-acetaminophen-dichloralphenazone (MIDRIN) 65-325-100 MG capsule Take 1-2 capsules by mouth 4 (four) times daily as needed for migraine. Maximum 5 capsules in 12 hours for migraine headaches, 8 capsules in 24 hours for tension headaches.   . topiramate (TOPAMAX) 100 MG tablet Take 1 tablet (100 mg total) by mouth at bedtime.   . [DISCONTINUED] DULoxetine (CYMBALTA) 60 MG capsule Take 120 mg by mouth daily.   . [DISCONTINUED] topiramate (TOPAMAX) 25 MG tablet Take 100 mg by mouth at bedtime.   . frovatriptan (FROVA) 2.5 MG tablet Take 1 tablet (2.5 mg total) by mouth as needed for migraine. If recurs, may repeat after 2 hours. Max of 3 tabs in 24 hours. (Patient not taking: Reported on 02/13/2015)   . HYDROcodone-acetaminophen (NORCO/VICODIN) 5-325 MG tablet Take 1-2 tablets by mouth every 6 (six) hours as needed for moderate pain or severe pain.   . ibupMarland Kitchenofen (ADVIL,MOTRIN) 200 MG tablet Take 600-800 mg by mouth every 6 (six) hours as needed.   . ondansetron (ZOFRAN) 4 MG tablet Take 1 tablet (4 mg total) by mouth every 8 (eight) hours as needed for nausea or vomiting. (Patient not taking: Reported on 02/13/2015)   . [  DISCONTINUED] DULoxetine (CYMBALTA) 60 MG capsule TAKE ONE CAPSULE BY MOUTH DAILY (Patient taking differently: TAKE ONE CAPSULE BY MOUTH DAILY (177m))   . [DISCONTINUED] Hydrocodone-Acetaminophen (VICODIN ES) 7.5-300 MG TABS Take 1-2 tablets by mouth as needed. 01/26/2014: Using old rx, uses rarely, for migraines  . [DISCONTINUED] predniSONE (STERAPRED UNI-PAK 21 TAB) 10 MG (21) TBPK tablet Use as manufacturer recommends for taper dose   . [DISCONTINUED] topiramate (TOPAMAX)  25 MG tablet TAKE 1 TABLET BY MOUTH AT BEDTIME. AFTER A WEEK INCREASE TO 2 TABLETS AT BEDTIME.  CAN INCREASE UP TO 2 TABLETS TWICE DAILY IF NEEDED (Patient taking differently: TAKE 1 TABLET BY MOUTH AT BEDTIME. AFTER A WEEK INCREASE TO 2 TABLETS AT BEDTIME.  CAN INCREASE UP TO 2 TABLETS TWICE DAILY IF NEEDED (1046m)    No facility-administered encounter medications on file as of 02/13/2015.   (hydrocodone prescribed today, not prior to visit).  Allergies  Allergen Reactions  . Penicillins Other (See Comments)    Syncope.  . Sulfa Antibiotics Rash    ROS: No fever, chills, URI symptoms, cough, shortness of breath, chest pain, depressive symptoms, suicidal thoughts, GI or GU complaints.  Headaches as per HPI.  +intentional weight loss.  PHYSICAL EXAM: BP 102/70 mmHg  Pulse 84  Ht 5' 4.25" (1.632 m)  Wt 156 lb 12.8 oz (71.124 kg)  BMI 26.70 kg/m2  LMP 01/18/2015  Well developed, pleasant female in no distress HEENT: PERRL, EOMI, conjunctiva clear Neck: no lymphadenopathy, thyromegaly or mass Heart: regular rate and rhythm without murmur Lungs: clear bilaterally Abdomen: soft, nontender, no organomegaly or mass Extremities: no edema Skin: normal turgor, no rashes Psych: normal mood, affect, hygiene and grooming. Normal eye contact, speech Neuro: alert and oriented. Cranial nerves intact, normal strength, gait  ASSESSMENT/PLAN:  Menstrual migraine without status migrainosus, not intractable - continue Topamax; remain off OCP's. Discuss contraceptive options with GYN, consider BTL/Essure. Frova and zofran prn, hydrocodone if ineffective - Plan: topiramate (TOPAMAX) 100 MG tablet, HYDROcodone-acetaminophen (NORCO/VICODIN) 5-325 MG tablet  Depression, major, in [place] ([place]- improved with dose increase to 12027mymbalta, continue; consider taper down once decreased stressors (after custody battle over) - Plan: DULoxetine (CYMBALTA) 60 MG capsule  Stress due to marital  problems  Need for prophylactic vaccination and inoculation against influenza - Plan: Flu Vaccine QUAD 36+ mos PF IM (Fluarix & Fluzone Quad PF)   F/u 1 year for med check, but to touch base in 6 months (when med refills are needed).  F/u sooner if not doing well

## 2015-04-06 ENCOUNTER — Telehealth: Payer: Self-pay | Admitting: Family Medicine

## 2015-04-06 DIAGNOSIS — F331 Major depressive disorder, recurrent, moderate: Secondary | ICD-10-CM

## 2015-04-06 MED ORDER — BUPROPION HCL ER (XL) 150 MG PO TB24: ORAL_TABLET | ORAL | Status: DC

## 2015-04-06 NOTE — Telephone Encounter (Signed)
This was actually face to face, not phone encounter (at her place of work).  Pt states that her depression hasn't been well controlled, currently on 120 mg of Cymbalta.  She has gained 30# in the last few weeks, feels unmotivated, going to bed 7pm (and sleeping all night).  Not motivated to clean her house, exercise.  Eating children's Halloween candy. Ongoing issues with ex-husband re: custody (last court date was 11/10, but still never started, delayed now until the end of February). She had significant sedation from SSRI's (Lexapro and citalopram) in the past.  Has never been on Wellbutrin. Currently on topamax for migraine prevention.  We will start wellbutrin XL 131m daily, and cut back to 679mdaily of cymbalta for a week.  Then increase to 30029mellbutrin, and taper off Cymbalta (qod).  If depression not well controlled on Wellbutrin alone, consider adding Prozac (discussed starting 57m36mblet, to start at 1/2 tab, given sensitivity to SSRI's in past--prozac being the least likely to cause sedation). Reasoning for switching from cymbalta to SSRI due to both meds (wellbutrin and cymbalta) having NE effects.  We also briefly discussed Trintellix.  Pt will touch base with us wKoreahin the next 3-4 weeks with how she is doing, sooner if not tolerating meds.  (no refill of 150mg43me given, as will change to 300mg,16mtolerating)

## 2015-04-18 ENCOUNTER — Telehealth: Payer: Self-pay | Admitting: Family Medicine

## 2015-04-18 NOTE — Telephone Encounter (Signed)
Pt states she will be dropping off a form to the office, needing biometrics for work (Ht, Wt, BP). She also let me know that I might be hearing from CPS, as her ex-husband called them.

## 2015-04-19 ENCOUNTER — Telehealth: Payer: Self-pay | Admitting: Family Medicine

## 2015-04-19 NOTE — Telephone Encounter (Signed)
Pt dropped off Biometrics collection form for you to complete and ask that it be mailed in envelope provided Form in your folder

## 2015-04-22 NOTE — Telephone Encounter (Signed)
FFO.  Please scan copy, and mail original in envelope provided

## 2015-04-23 NOTE — Telephone Encounter (Signed)
Done

## 2015-05-09 ENCOUNTER — Telehealth: Payer: Self-pay | Admitting: Family Medicine

## 2015-05-09 DIAGNOSIS — F331 Major depressive disorder, recurrent, moderate: Secondary | ICD-10-CM

## 2015-05-09 DIAGNOSIS — F325 Major depressive disorder, single episode, in full remission: Secondary | ICD-10-CM

## 2015-05-09 MED ORDER — DULOXETINE HCL 30 MG PO CPEP: 30.0000 mg | ORAL_CAPSULE | Freq: Every day | ORAL | Status: DC

## 2015-05-09 MED ORDER — BUPROPION HCL ER (XL) 300 MG PO TB24: 300.0000 mg | ORAL_TABLET | Freq: Every day | ORAL | Status: DC

## 2015-05-09 NOTE — Telephone Encounter (Signed)
Requesting refill of Wellbutrin XL 347m (doing fine on two 1521mtabs). Also, asking for cymbalta 3074m Megan Butler has been trying to taper off, but after 36 hours Megan Butler gets dizzy and has to take one.  Megan Butler states that Megan Butler has more energy, feeling more optimistic, doing well (other than having a hard time, related to dizziness, in tapering off the cymbalta).  rx's done--30 x 1 refill of the cymbalta 59m5mon't think Megan Butler will be staying on this, just using it to taper off.  Has appt scheduled for October

## 2015-05-13 NOTE — L&D Delivery Note (Signed)
Delivery Note At 6:59 PM a viable female was delivered via Vaginal, Spontaneous Delivery (Presentation: ROA  ).  APGAR: 9, 9; weight  pending Placenta status: , .  Cord:  with the following complications: .  Cord pH: not collected  Anesthesia:  none Episiotomy: ;None Lacerations: None Suture Repair: N/A Est. Blood Loss (mL): 150  Mom to postpartum.  Baby to Couplet care / Skin to Skin.  Janyth Pupa, M 03/26/2016, 7:13 PM

## 2015-06-19 LAB — OB RESULTS CONSOLE RPR: RPR: NONREACTIVE

## 2015-06-19 LAB — OB RESULTS CONSOLE GC/CHLAMYDIA
Chlamydia: NEGATIVE
GC PROBE AMP, GENITAL: NEGATIVE

## 2015-06-19 LAB — OB RESULTS CONSOLE HIV ANTIBODY (ROUTINE TESTING): HIV: NONREACTIVE

## 2015-06-19 LAB — OB RESULTS CONSOLE RUBELLA ANTIBODY, IGM: Rubella: IMMUNE

## 2015-06-19 LAB — OB RESULTS CONSOLE ABO/RH: RH Type: POSITIVE

## 2015-06-19 LAB — OB RESULTS CONSOLE HEPATITIS B SURFACE ANTIGEN: Hepatitis B Surface Ag: NEGATIVE

## 2015-06-19 LAB — OB RESULTS CONSOLE ANTIBODY SCREEN: Antibody Screen: NEGATIVE

## 2015-06-22 ENCOUNTER — Other Ambulatory Visit: Payer: Self-pay | Admitting: Obstetrics and Gynecology

## 2015-06-22 DIAGNOSIS — O209 Hemorrhage in early pregnancy, unspecified: Secondary | ICD-10-CM

## 2015-07-28 ENCOUNTER — Other Ambulatory Visit (HOSPITAL_COMMUNITY): Payer: Self-pay | Admitting: Certified Nurse Midwife

## 2015-07-28 DIAGNOSIS — F331 Major depressive disorder, recurrent, moderate: Secondary | ICD-10-CM

## 2015-07-30 ENCOUNTER — Other Ambulatory Visit (HOSPITAL_COMMUNITY)
Admission: AD | Admit: 2015-07-30 | Discharge: 2015-07-30 | Disposition: A | Discharge status: Home / Self Care | Payer: BLUE CROSS/BLUE SHIELD | Source: Ambulatory Visit | Attending: Obstetrics & Gynecology | Admitting: Obstetrics & Gynecology

## 2015-07-30 ENCOUNTER — Ambulatory Visit (INDEPENDENT_AMBULATORY_CARE_PROVIDER_SITE_OTHER): Payer: BLUE CROSS/BLUE SHIELD | Admitting: Family Medicine

## 2015-07-30 ENCOUNTER — Encounter: Payer: Self-pay | Admitting: Family Medicine

## 2015-07-30 ENCOUNTER — Other Ambulatory Visit: Payer: Self-pay | Admitting: Obstetrics & Gynecology

## 2015-07-30 VITALS — BP 120/72 | HR 82 | Ht 64.5 in | Wt 176.4 lb

## 2015-07-30 DIAGNOSIS — Z3491 Encounter for supervision of normal pregnancy, unspecified, first trimester: Secondary | ICD-10-CM | POA: Diagnosis not present

## 2015-07-30 DIAGNOSIS — M546 Pain in thoracic spine: Secondary | ICD-10-CM

## 2015-07-30 DIAGNOSIS — M62838 Other muscle spasm: Secondary | ICD-10-CM

## 2015-07-30 DIAGNOSIS — M549 Dorsalgia, unspecified: Secondary | ICD-10-CM

## 2015-07-30 LAB — HCG, QUANTITATIVE, PREGNANCY: hCG, Beta Chain, Quant, S: 4007 m[IU]/mL — ABNORMAL HIGH (ref <5)

## 2015-07-30 NOTE — Progress Notes (Signed)
Chief Complaint  Patient presents with  . Back Pain    thoracic back pain started Friday, excruciating by Sunday. Would also like to have hcg, quantative today if possible.    She reached into the refrigerator on Thursday morning, and had sudden onset of pain in her thoracic spine.  Pain was excruciating, knife-stabbing, without radiation.  Hurt to lift something or raise her arms. The rest of the day she had just mild discomfort. Saturday wasn't too bad. Yesterday she had to lay in bed all day, the only way she was comfortable. Thermacare decreasede from 9/10 to 10/10.  Laying flat on her back is the only comfortable position. Woke up today and yesterday just fine, but as soon as she stands up the pain recurs. Less pain if leaning forward, only resolved with laying supine.  She has pain in paraspinous muscles bilaterally, R>L today, slightly better if leans to the left.  Tylenol doesn't help. Possible slight improvement with heat.  She hasn't taken any other medications, as she is [redacted] weeks pregnant.  She is asking for quantitative hcg to be drawn today--she had it checked by Dr. Nelda Marseille Thursday, and it was 1946. She recommended patient have it checked again at [facility] on [address] get results, thinks it wasn't done, asking to have it rechecked today (due to lack of cycle between miscarriage and this pregnancy, at higher risk for miscarriage). No bleeding, cramping, vaginal discharge or abdominal pain  PMH, PSH, SH reviewed.  Outpatient Encounter Prescriptions as of 07/30/2015  Medication Sig  . Prenatal Vit-Fe Fumarate-FA (PRENATAL MULTIVITAMIN) TABS tablet Take 1 tablet by mouth daily at 12 noon.  . [DISCONTINUED] buPROPion (WELLBUTRIN XL) 300 MG 24 hr tablet Take 1 tablet (300 mg total) by mouth daily.  . [DISCONTINUED] DULoxetine (CYMBALTA) 30 MG capsule Take 1 capsule (30 mg total) by mouth at bedtime.  . [DISCONTINUED] frovatriptan (FROVA) 2.5 MG tablet Take 1 tablet  (2.5 mg total) by mouth as needed for migraine. If recurs, may repeat after 2 hours. Max of 3 tabs in 24 hours. (Patient not taking: Reported on 02/13/2015)  . [DISCONTINUED] HYDROcodone-acetaminophen (NORCO/VICODIN) 5-325 MG tablet Take 1-2 tablets by mouth every 6 (six) hours as needed for moderate pain or severe pain.  . [DISCONTINUED] ibuprofen (ADVIL,MOTRIN) 200 MG tablet Take 600-800 mg by mouth every 6 (six) hours as needed.  . [DISCONTINUED] isometheptene-acetaminophen-dichloralphenazone (MIDRIN) 65-325-100 MG capsule Take 1-2 capsules by mouth 4 (four) times daily as needed for migraine. Maximum 5 capsules in 12 hours for migraine headaches, 8 capsules in 24 hours for tension headaches.  . [DISCONTINUED] ondansetron (ZOFRAN) 4 MG tablet Take 1 tablet (4 mg total) by mouth every 8 (eight) hours as needed for nausea or vomiting. (Patient not taking: Reported on 02/13/2015)  . [DISCONTINUED] topiramate (TOPAMAX) 100 MG tablet Take 1 tablet (100 mg total) by mouth at bedtime.   No facility-administered encounter medications on file as of 07/30/2015.   Currently only taking prenatal vitamin and tylenol prn. Other meds stopped when she found out she was pregnant recently (prior pregnancy, that ended in miscarriage).  Allergies  Allergen Reactions  . Penicillins Other (See Comments)    Syncope.  . Sulfa Antibiotics Rash    ROS: no fever, chills, URI symptoms, cough, headache, dizziness, ches tpain, shortness of breath, nausea, vomiting, abdominal pain, vaginal discharge, bleeding. No rash, edema bleeding, bruising. Moods have been good. No bowel or bladder complaints.   PHYSICAL EXAM: BP 120/72 mmHg  Pulse 82  Ht 5' 4.5" (1.638 m)  Wt 176 lb 6.4 oz (80.015 kg)  BMI 29.82 kg/m2  LMP 06/22/2015  Pleasant female, comfortable when laying supine Neck: no lymphadenopathy or mass Back: no CVA tenderness, spinal tenderness.  She has paraspinous tenderness R>L and rhomboid spasm. Neuro: normal  strength, sensation, gait Abdomen: soft, nontender, no organomegaly or mass  ASSESSMENT/PLAN:  Acute thoracic back pain - heat vs ice, stretches shown, massage. refer to PT. Avoiding OTC meds due to early pregnancy - Plan: CANCELED: Ambulatory referral to Physical Therapy  Intrauterine normal pregnancy, first trimester - Plan: hCG, quantitative, pregnancy, CANCELED: HCG, quantitative, pregnancy, CANCELED: hCG, quantitative, pregnancy  Muscle spasm - Plan: CANCELED: Ambulatory referral to Physical Therapy  Researched safety of NSAIds and pain meds (narcotics, tramadol), muscle relaxants. Likely okay to use short-term NSAID and/or pain med, but will refer to PT to try this first (along with topical/supportive measures).  Send copy of hcg to Dr. Nelda Marseille at [facility]

## 2015-07-30 NOTE — Patient Instructions (Signed)
I will let you know the hcg result tomorrow. Try heat, stretch, icy hot or biofreeze. We will try and get you in for physical therapy tomorrow. Use tylenol first, but probably okay to use some ibuprofen if needed for pain relief.  Touch base with me and let me know how you're doing tomorrow.

## 2015-07-31 LAB — HCG, QUANTITATIVE, PREGNANCY: hCG, Beta Chain, Quant, S: 6139.6 m[IU]/mL — ABNORMAL HIGH

## 2015-08-07 ENCOUNTER — Other Ambulatory Visit (HOSPITAL_COMMUNITY): Payer: Self-pay | Admitting: Obstetrics & Gynecology

## 2015-08-07 ENCOUNTER — Ambulatory Visit (HOSPITAL_COMMUNITY)
Admission: RE | Admit: 2015-08-07 | Discharge: 2015-08-07 | Disposition: A | Discharge status: Home / Self Care | Payer: BLUE CROSS/BLUE SHIELD | Source: Ambulatory Visit | Attending: Obstetrics & Gynecology | Admitting: Obstetrics & Gynecology

## 2015-08-07 ENCOUNTER — Ambulatory Visit (HOSPITAL_COMMUNITY): Payer: BLUE CROSS/BLUE SHIELD

## 2015-08-07 DIAGNOSIS — O09299 Supervision of pregnancy with other poor reproductive or obstetric history, unspecified trimester: Secondary | ICD-10-CM

## 2015-08-07 DIAGNOSIS — Z3A01 Less than 8 weeks gestation of pregnancy: Secondary | ICD-10-CM | POA: Insufficient documentation

## 2015-08-07 DIAGNOSIS — O208 Other hemorrhage in early pregnancy: Secondary | ICD-10-CM | POA: Insufficient documentation

## 2015-08-07 DIAGNOSIS — Z36 Encounter for antenatal screening of mother: Secondary | ICD-10-CM | POA: Diagnosis not present

## 2015-08-20 DIAGNOSIS — R3915 Urgency of urination: Secondary | ICD-10-CM | POA: Diagnosis not present

## 2015-08-24 DIAGNOSIS — F4322 Adjustment disorder with anxiety: Secondary | ICD-10-CM | POA: Diagnosis not present

## 2015-08-31 DIAGNOSIS — O09521 Supervision of elderly multigravida, first trimester: Secondary | ICD-10-CM | POA: Diagnosis not present

## 2015-09-04 DIAGNOSIS — O09521 Supervision of elderly multigravida, first trimester: Secondary | ICD-10-CM | POA: Diagnosis not present

## 2015-09-12 ENCOUNTER — Encounter (HOSPITAL_COMMUNITY): Payer: Self-pay | Admitting: Obstetrics & Gynecology

## 2015-09-12 ENCOUNTER — Other Ambulatory Visit (HOSPITAL_COMMUNITY): Payer: Self-pay | Admitting: Obstetrics & Gynecology

## 2015-09-12 DIAGNOSIS — O09521 Supervision of elderly multigravida, first trimester: Secondary | ICD-10-CM

## 2015-09-12 DIAGNOSIS — Z3A13 13 weeks gestation of pregnancy: Secondary | ICD-10-CM

## 2015-09-12 DIAGNOSIS — Z3682 Encounter for antenatal screening for nuchal translucency: Secondary | ICD-10-CM

## 2015-09-12 DIAGNOSIS — Q9388 Other microdeletions: Secondary | ICD-10-CM

## 2015-09-14 DIAGNOSIS — F4322 Adjustment disorder with anxiety: Secondary | ICD-10-CM | POA: Diagnosis not present

## 2015-09-18 ENCOUNTER — Ambulatory Visit (HOSPITAL_COMMUNITY)
Admission: RE | Admit: 2015-09-18 | Discharge: 2015-09-18 | Disposition: A | Discharge status: Home / Self Care | Payer: BLUE CROSS/BLUE SHIELD | Source: Ambulatory Visit | Attending: Obstetrics & Gynecology | Admitting: Obstetrics & Gynecology

## 2015-09-18 ENCOUNTER — Other Ambulatory Visit (HOSPITAL_COMMUNITY): Payer: Self-pay | Admitting: Obstetrics & Gynecology

## 2015-09-18 ENCOUNTER — Encounter (HOSPITAL_COMMUNITY): Payer: Self-pay

## 2015-09-18 VITALS — BP 113/74 | HR 75 | Wt 183.6 lb

## 2015-09-18 DIAGNOSIS — O09521 Supervision of elderly multigravida, first trimester: Secondary | ICD-10-CM | POA: Insufficient documentation

## 2015-09-18 DIAGNOSIS — O09529 Supervision of elderly multigravida, unspecified trimester: Secondary | ICD-10-CM | POA: Insufficient documentation

## 2015-09-18 DIAGNOSIS — O289 Unspecified abnormal findings on antenatal screening of mother: Secondary | ICD-10-CM

## 2015-09-18 DIAGNOSIS — Z3A12 12 weeks gestation of pregnancy: Secondary | ICD-10-CM

## 2015-09-18 DIAGNOSIS — O283 Abnormal ultrasonic finding on antenatal screening of mother: Secondary | ICD-10-CM | POA: Insufficient documentation

## 2015-09-18 DIAGNOSIS — Z3A13 13 weeks gestation of pregnancy: Secondary | ICD-10-CM

## 2015-09-18 DIAGNOSIS — Z315 Encounter for genetic counseling: Secondary | ICD-10-CM | POA: Diagnosis not present

## 2015-09-18 DIAGNOSIS — Q9388 Other microdeletions: Secondary | ICD-10-CM

## 2015-09-18 DIAGNOSIS — Z36 Encounter for antenatal screening of mother: Secondary | ICD-10-CM | POA: Diagnosis not present

## 2015-09-18 DIAGNOSIS — Z3682 Encounter for antenatal screening for nuchal translucency: Secondary | ICD-10-CM

## 2015-09-18 NOTE — Progress Notes (Signed)
Genetic Counseling  Visit Summary Note  Appointment Date: 09/18/2015 Referred By: Janyth Pupa  Date of Birth: [birth year]  Pregnancy history: [id number] Estimated Date of Delivery: 04/01/16 Estimated Gestational Age: [redacted]w[redacted]d I met with Megan Butler her fianc, Mr. 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   In summary:  Reviewed the NIPS results and the associated 1 in 5 risk for fetal 22q11 deletion syndrome  Discussed screening options  Ultrasound- NT today; anatomy scheduled  Discussed diagnostic testing options  [facility]- declined  Amniocentesis- scheduled on 10/09/15  Reviewed ACOG recommended carrier screening- declined  Reviewed family history  Pt's son, with a different partner, has an apparently isolated VSD  Fetal echocardiogram offered- declined unless anatomy ultrasound is abnormal  Mrs. ORicard Dillonelected to have NIPS through her referring provider's office as routine screening for fetal aneuploidy due to her age of 89 Specifically, Ms. ORicard Dillonhad Panorama screening for aneuploidy and 22q11 deletion syndrome. The results were low risk for fetal trisomies 21, 13, 18 as well as for sex chromosome conditions and triploidy; however, the results were "high risk" for 22q11.2 deletion syndrome", showing a 1 in 5 chance for this condition. We reviewed that while Panorama has a high sensitivity and specificity, the testing is not considered to be diagnostic.  This couple was counseled that the positive predictive value is the percentage of patients with a positive result who have a true positive (i.e. fetus with 22q11 deletion syndrome). The PPV in patients who have an abnormal ultrasound finding suggestive of 22q11 deletion syndrome is 100%; however, in patients who had routine population screening for 22q11 deletion syndrome, the PPV is 20%.   We reviewed that 22q11.2 deletion syndrome is  the most common microdeletion syndrome, occurring in approximately 1 in every 2,000-4,000 individuals. The incidence is estimated to be as high as 1 in 637in children born with congenital heart disease. This couple was counseled that 22q11.2 deletion syndrome, also called DiGeorge syndrome or Velocardiofacial syndrome, is caused by a deletion of genes on the long arm of chromosome 22 (the q11.2 region of the chromosome). Common features of this condition include heart defects (76%), cleft palate (76%), characteristic facial features, immune deficiency (77%), and intellectual disability (>90%). Less commonly, individuals with 22q11 deletion syndrome may also have an autoimmune disease, growth hormone deficiency, hearing loss, and psychiatric illness (20-30%). Symptoms may vary significantly from person to person, even among members of the same family.    We reviewed chromosomes and chromosome structure. Specifically we discussed that chromosomes are divided into two arms, a p (small) arm and a q (long) arm. The light and dark bands on each chromosome are numbered; therefore 22p11 deletion syndrome refers to a deletion of chromosome material on the long arm of chromosome 22 at band 11. We reviewed that because genes are packaged on our chromosomes, a deletion of a chromosome region translates to a deletion of genes within that region, and can cause structural abnormalities, intellectual disability, and a variety of health concerns.  Approximately 93% of individuals with 22q11.2 deletion syndrome have a de novo (occurring for the first time in that individual) deletion of the 22q11.2 region, and approximately 7% of individuals inherited the deletion from one parent, who also has a deletion of the q11.2 region of chromosome 22.  22q11.2 deletion syndrome is an autosomal dominant condition, meaning once a person has the condition, they have a 50% (1 in  2) chance with each pregnancy to pass on the chromosome 22 that  has the deletion and also have a child with the condition.    This couple was counseled regarding the availability of screening and confirmatory prenatal testing for 22q11 deletion syndrome. We discussed FISH analysis for 22q11 deletion syndrome via [facility] or amniocentesis. We reviewed the specific risks, benefits, and limitations of this testing, including the associated risk(s) for pregnancy loss. We also discussed the option of postnatal testing via cord or peripheral blood. We then discussed the availability of a detailed ultrasound to look for the common physical differences associated with the condition. They understand that ultrasound cannot detect all genetic conditions or birth defects, therefore, a normal ultrasound, while reassuring, would not eliminate the possibility of a condition in the fetus. After thoughtful consideration of their options, this couple elected to return on Oct 09, 2015 for an amniocentesis. They declined [facility]. Ms. Ricard Butler also elected to have a nuchal translucency ultrasound today. The report will be documented separately. She would like to return for a detailed anatomy ultrasound at [redacted] weeks gestation. This appointment was scheduled today.  Both family histories were reviewed and found to be contributory for Ms. Ricard Butler' having a son, with a different partner, who was born with an apparently isolated congenital heart defect (VSD). No surgery was required as the difference closed on its own. We reviewed that CHDs can be multifactorial, environmental, or genetic in etiology.  Given that this child is doing well and has no other medical or developmental concerns, we discussed that his heart difference is likely isolated and nonsyndromic. The risk of recurrence is expected to be 1.5%, which is just above the general population risk of 1%. We discussed that if the child has a specific undiagnosed syndrome, the risk of recurrence could be higher. We discussed the availability of a fetal  echocardiogram. The patient declined, unless the anatomy ultrasound findings are suggestive of a CHD.  Without further information regarding the provided family history, an accurate genetic risk cannot be calculated. Further genetic counseling is warranted if more information is obtained.  Ms. Ricard Butler denied exposure to environmental toxins or chemical agents. She denied the use of alcohol, tobacco or street drugs. She denied significant viral illnesses during the course of her pregnancy.   I counseled this couple regarding the above risks and available options.  The approximate face-to-face time with the genetic counselor was 47 minutes.  Filbert Schilder, MS  Certified Genetic Counselor

## 2015-09-21 DIAGNOSIS — F4322 Adjustment disorder with anxiety: Secondary | ICD-10-CM | POA: Diagnosis not present

## 2015-09-28 ENCOUNTER — Encounter (HOSPITAL_COMMUNITY): Payer: BLUE CROSS/BLUE SHIELD

## 2015-09-28 ENCOUNTER — Ambulatory Visit (HOSPITAL_COMMUNITY): Payer: BLUE CROSS/BLUE SHIELD

## 2015-09-28 ENCOUNTER — Other Ambulatory Visit (HOSPITAL_COMMUNITY): Payer: BLUE CROSS/BLUE SHIELD

## 2015-10-09 ENCOUNTER — Other Ambulatory Visit (HOSPITAL_COMMUNITY): Payer: Self-pay | Admitting: Maternal and Fetal Medicine

## 2015-10-09 ENCOUNTER — Encounter (HOSPITAL_COMMUNITY): Payer: Self-pay

## 2015-10-09 ENCOUNTER — Ambulatory Visit (HOSPITAL_COMMUNITY)
Admission: RE | Admit: 2015-10-09 | Discharge: 2015-10-09 | Disposition: A | Discharge status: Home / Self Care | Payer: BLUE CROSS/BLUE SHIELD | Source: Ambulatory Visit | Attending: Obstetrics & Gynecology | Admitting: Obstetrics & Gynecology

## 2015-10-09 VITALS — BP 116/72 | HR 84 | Wt 185.4 lb

## 2015-10-09 DIAGNOSIS — O09523 Supervision of elderly multigravida, third trimester: Secondary | ICD-10-CM

## 2015-10-09 DIAGNOSIS — Z3A15 15 weeks gestation of pregnancy: Secondary | ICD-10-CM

## 2015-10-09 DIAGNOSIS — Z1389 Encounter for screening for other disorder: Secondary | ICD-10-CM

## 2015-10-09 DIAGNOSIS — Z36 Encounter for antenatal screening for chromosomal anomalies: Secondary | ICD-10-CM

## 2015-10-09 DIAGNOSIS — O289 Unspecified abnormal findings on antenatal screening of mother: Secondary | ICD-10-CM | POA: Insufficient documentation

## 2015-10-09 DIAGNOSIS — O09521 Supervision of elderly multigravida, first trimester: Secondary | ICD-10-CM | POA: Diagnosis not present

## 2015-10-09 DIAGNOSIS — O09529 Supervision of elderly multigravida, unspecified trimester: Secondary | ICD-10-CM

## 2015-10-09 DIAGNOSIS — O09522 Supervision of elderly multigravida, second trimester: Secondary | ICD-10-CM | POA: Diagnosis not present

## 2015-10-09 LAB — FISH, DIGEORGE

## 2015-10-09 LAB — AP-AFP (ALPHA FETOPROTEIN)

## 2015-10-09 LAB — ROUTINE CHROMOSOME - KARYOTYPE + FISH

## 2015-10-19 ENCOUNTER — Telehealth (HOSPITAL_COMMUNITY): Payer: Self-pay | Admitting: MS"

## 2015-10-19 ENCOUNTER — Other Ambulatory Visit (HOSPITAL_COMMUNITY): Payer: Self-pay

## 2015-10-19 NOTE — Telephone Encounter (Signed)
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 Discussed with Megan Butler that this testing was within normal range, and that the laboratory reported that all cells analyzed from amniocentesis were found to have both copies of this region on chromosome 22. Thus, there is no evidence of deletion of this region. Final karyotype analysis is still pending. Patient had no additional questions at this time.  [name] 10/19/2015 11:48 AM

## 2015-10-19 NOTE — Telephone Encounter (Signed)
[place] to discuss the final karyotype results from her amniocentesis We reviewed that these are within normal limits (20, XY).  We again reviewed that the [facility] for 22q11 region was also within normal range.  All questions were answered to her satisfaction, she was encouraged to call with additional questions or concerns.  Chipper Oman, MS Insurance risk surveyor

## 2015-10-23 ENCOUNTER — Other Ambulatory Visit (HOSPITAL_COMMUNITY): Payer: Self-pay

## 2015-10-24 ENCOUNTER — Other Ambulatory Visit (HOSPITAL_COMMUNITY): Payer: Self-pay

## 2015-10-26 DIAGNOSIS — F4322 Adjustment disorder with anxiety: Secondary | ICD-10-CM | POA: Diagnosis not present

## 2015-10-29 ENCOUNTER — Other Ambulatory Visit (HOSPITAL_COMMUNITY): Payer: Self-pay

## 2015-10-31 ENCOUNTER — Ambulatory Visit (HOSPITAL_COMMUNITY): Admission: RE | Admit: 2015-10-31 | Payer: BLUE CROSS/BLUE SHIELD | Source: Ambulatory Visit

## 2015-12-07 DIAGNOSIS — F4322 Adjustment disorder with anxiety: Secondary | ICD-10-CM | POA: Diagnosis not present

## 2015-12-21 DIAGNOSIS — F4322 Adjustment disorder with anxiety: Secondary | ICD-10-CM | POA: Diagnosis not present

## 2015-12-27 DIAGNOSIS — O09522 Supervision of elderly multigravida, second trimester: Secondary | ICD-10-CM | POA: Diagnosis not present

## 2015-12-28 DIAGNOSIS — F4322 Adjustment disorder with anxiety: Secondary | ICD-10-CM | POA: Diagnosis not present

## 2016-01-04 DIAGNOSIS — F4322 Adjustment disorder with anxiety: Secondary | ICD-10-CM | POA: Diagnosis not present

## 2016-01-22 DIAGNOSIS — Z23 Encounter for immunization: Secondary | ICD-10-CM | POA: Diagnosis not present

## 2016-02-01 DIAGNOSIS — F4322 Adjustment disorder with anxiety: Secondary | ICD-10-CM | POA: Diagnosis not present

## 2016-02-14 ENCOUNTER — Encounter: Payer: BLUE CROSS/BLUE SHIELD | Admitting: Family Medicine

## 2016-02-15 DIAGNOSIS — F4322 Adjustment disorder with anxiety: Secondary | ICD-10-CM | POA: Diagnosis not present

## 2016-02-28 DIAGNOSIS — O09523 Supervision of elderly multigravida, third trimester: Secondary | ICD-10-CM | POA: Diagnosis not present

## 2016-02-29 DIAGNOSIS — F4322 Adjustment disorder with anxiety: Secondary | ICD-10-CM | POA: Diagnosis not present

## 2016-03-17 DIAGNOSIS — O09523 Supervision of elderly multigravida, third trimester: Secondary | ICD-10-CM | POA: Diagnosis not present

## 2016-03-18 LAB — OB RESULTS CONSOLE GBS: STREP GROUP B AG: NEGATIVE

## 2016-03-19 ENCOUNTER — Telehealth (HOSPITAL_COMMUNITY): Payer: Self-pay | Admitting: *Deleted

## 2016-03-19 ENCOUNTER — Encounter (HOSPITAL_COMMUNITY): Payer: Self-pay | Admitting: *Deleted

## 2016-03-19 NOTE — Telephone Encounter (Signed)
Preadmission screen  

## 2016-03-20 ENCOUNTER — Encounter (HOSPITAL_COMMUNITY): Payer: Self-pay

## 2016-03-20 ENCOUNTER — Inpatient Hospital Stay (HOSPITAL_COMMUNITY)
Admission: AD | Admit: 2016-03-20 | Discharge: 2016-03-20 | Disposition: A | Discharge status: Home / Self Care | Payer: BLUE CROSS/BLUE SHIELD | Source: Ambulatory Visit | Attending: Obstetrics & Gynecology | Admitting: Obstetrics & Gynecology

## 2016-03-20 DIAGNOSIS — Z880 Allergy status to penicillin: Secondary | ICD-10-CM | POA: Diagnosis not present

## 2016-03-20 DIAGNOSIS — N898 Other specified noninflammatory disorders of vagina: Secondary | ICD-10-CM

## 2016-03-20 DIAGNOSIS — Z3A38 38 weeks gestation of pregnancy: Secondary | ICD-10-CM | POA: Insufficient documentation

## 2016-03-20 DIAGNOSIS — Z87891 Personal history of nicotine dependence: Secondary | ICD-10-CM | POA: Diagnosis not present

## 2016-03-20 DIAGNOSIS — O26893 Other specified pregnancy related conditions, third trimester: Secondary | ICD-10-CM | POA: Diagnosis not present

## 2016-03-20 LAB — POCT FERN TEST: POCT FERN TEST: NEGATIVE

## 2016-03-20 NOTE — MAU Note (Signed)
Pt reports she had ctx last night about 10 min apart now less. Woke up and is having clear fluid leaking out.

## 2016-03-20 NOTE — Discharge Instructions (Signed)

## 2016-03-20 NOTE — MAU Provider Note (Signed)
Chief Complaint:  Rupture of Membranes   First Provider Initiated Contact with Patient 03/20/16 1157     HPI: Megan Butler is a 40 y.o. [id number] at 42w2dho presents to maternity admissions reporting leaking of fluid this morning.  No bleeding.  No painful contractions.. She reports good fetal movement, denies vaginal bleeding, vaginal itching/burning, urinary symptoms, h/a, dizziness, n/v, diarrhea, constipation or fever/chills.  She denies headache, visual changes or RUQ abdominal pain.  Vaginal Discharge  The patient's primary symptoms include vaginal discharge. The patient's pertinent negatives include no genital itching, genital odor, pelvic pain or vaginal bleeding. This is a new problem. The current episode started today. The problem occurs intermittently. The problem has been unchanged. The patient is experiencing no pain. She is pregnant. Pertinent negatives include no abdominal pain, back pain, chills, constipation, diarrhea, fever, nausea or vomiting. The vaginal discharge was clear and watery. There has been no bleeding. She has not been passing clots. She has not been passing tissue. Nothing aggravates the symptoms.   RN Note: Pt reports she had ctx last night about 10 min apart now less. Woke up and is having clear fluid leaking out.  Past Medical History: Past Medical History:  Diagnosis Date  . Depression 2007   tx'd x 6 months postpartum, recurred in 2009,  . Ectopic pregnancy 2000   and peritonitis--s/p surgery  . Migraine    menstrual  . Overweight   . Vaginal Pap smear, abnormal   . Vitamin D deficiency     Past obstetric history: OB History  Gravida Para Term Preterm AB Living  6 3 3   2 3   SAB TAB Ectopic Multiple Live Births  1   1   3     # Outcome Date GA Lbr Len/2nd Weight Sex Delivery Anes PTL Lv  6 Current           5 SAB 2017          4 Term 2011 325w0d6 lb 4 oz (2.835 kg) M Vag-Spont EPI  LIV  3 Term 2007 3861w0d lb 4 oz (3.289 kg)  Vag-Spont    LIV  2 Term 2005 39w73w0dlb 10 oz (3.005 kg)  Vag-Spont EPI  LIV  1 Ectopic 2000              Past Surgical History: Past Surgical History:  Procedure Laterality Date  . LAPAROSCOPY  2000   peritonitis, ectopic pregnancy  . LEEP    . WISDOM TOOTH EXTRACTION      Family History: Family History  Problem Relation Age of Onset  . Adopted: Yes    Social History: Social History  Substance Use Topics  . Smoking status: Former Smoker    Quit date: 05/12/1998  . Smokeless tobacco: Never Used  . Alcohol use Yes     Comment: 1-2 drinks per week.     Allergies:  Allergies  Allergen Reactions  . Penicillins Other (See Comments)    Syncope.  . Sulfa Antibiotics Rash    Meds:  Prescriptions Prior to Admission  Medication Sig Dispense Refill Last Dose  . Prenatal Vit-Fe Fumarate-FA (PRENATAL MULTIVITAMIN) TABS tablet Take 1 tablet by mouth daily at 12 noon.   Taking    I have reviewed patient's Past Medical Hx, Surgical Hx, Family Hx, Social Hx, medications and allergies.   ROS:  Review of Systems  Constitutional: Negative for chills and fever.  Gastrointestinal: Negative for abdominal pain, constipation, diarrhea, nausea and  vomiting.  Genitourinary: Positive for vaginal discharge. Negative for pelvic pain.  Musculoskeletal: Negative for back pain.   Other systems negative  Physical Exam  Patient Vitals for the past 24 hrs:  BP Temp Temp src Pulse Resp Height Weight  03/20/16 1105 121/80 97.7 F (36.5 C) Oral 94 18 5' 4"  (1.626 m) 192 lb 6.4 oz (87.3 kg)   Constitutional: Well-developed, well-nourished female in no acute distress.  Cardiovascular: normal rate and rhythm Respiratory: normal effort, clear to auscultation bilaterally GI: Abd soft, non-tender, gravid appropriate for gestational age.   No rebound or guarding. MS: Extremities nontender, no edema, normal ROM Neurologic: Alert and oriented x 4.  GU: Neg CVAT.  PELVIC EXAM: Cervix pink, visually closed,  without lesion, scant white creamy discharge, vaginal walls and external genitalia normal  Dilation: 1 Effacement (%): 30 Presentation: Undeterminable (Possible vertex) Exam by:: Wilhemena Durie RN  FHT:  Baseline 140 , moderate variability, accelerations present, no decelerations Contractions:  Irregular    Labs:    Results for orders placed or performed during the hospital encounter of 03/20/16 (from the past 24 hour(s))  Fern Test     Status: None   Collection Time: 03/20/16 12:30 PM  Result Value Ref Range   POCT Fern Test Negative = intact amniotic membranes    Imaging:  Bedside US done to confirm vertex Presentation is cephalic  Head is ever so slightly oblique, but primarily vertex  MAU Course/MDM: I have ordered labs and reviewed results.  NST reviewed Exam is negative for ruptured membranes.  Discussed signs of that, labor and when to return.  Assessment: SIUP at 63w2dVaginal discharge No evidence of ruptured membranes  Plan: Discharge home Labor precautions and fetal kick counts Follow up in Office for prenatal visits and recheck of cervix  Encouraged to return here or to other Urgent Care/ED if she develops worsening of symptoms, increase in pain, fever, or other concerning symptoms.   Pt stable at time of discharge.  MHansel FeinsteinCNM, MSN Certified Nurse-Midwife 03/20/2016 11:57 AM

## 2016-03-21 ENCOUNTER — Inpatient Hospital Stay (HOSPITAL_COMMUNITY)
Admission: RE | Admit: 2016-03-21 | Payer: BLUE CROSS/BLUE SHIELD | Source: Ambulatory Visit | Admitting: Obstetrics & Gynecology

## 2016-03-24 ENCOUNTER — Encounter (HOSPITAL_COMMUNITY): Payer: Self-pay | Admitting: *Deleted

## 2016-03-24 ENCOUNTER — Telehealth (HOSPITAL_COMMUNITY): Payer: Self-pay | Admitting: *Deleted

## 2016-03-24 NOTE — Telephone Encounter (Signed)
Preadmission screen  

## 2016-03-26 ENCOUNTER — Encounter (HOSPITAL_COMMUNITY): Payer: Self-pay

## 2016-03-26 ENCOUNTER — Inpatient Hospital Stay (HOSPITAL_COMMUNITY)
Admission: RE | Admit: 2016-03-26 | Discharge: 2016-03-28 | DRG: 775 | Disposition: A | Discharge status: Home / Self Care | Payer: BLUE CROSS/BLUE SHIELD | Source: Ambulatory Visit | Attending: Obstetrics & Gynecology | Admitting: Obstetrics & Gynecology

## 2016-03-26 DIAGNOSIS — Z23 Encounter for immunization: Secondary | ICD-10-CM | POA: Diagnosis not present

## 2016-03-26 DIAGNOSIS — Z3490 Encounter for supervision of normal pregnancy, unspecified, unspecified trimester: Secondary | ICD-10-CM

## 2016-03-26 DIAGNOSIS — Z3A39 39 weeks gestation of pregnancy: Secondary | ICD-10-CM

## 2016-03-26 DIAGNOSIS — Z3403 Encounter for supervision of normal first pregnancy, third trimester: Secondary | ICD-10-CM | POA: Diagnosis not present

## 2016-03-26 DIAGNOSIS — Q544 Congenital chordee: Secondary | ICD-10-CM | POA: Diagnosis not present

## 2016-03-26 LAB — CBC
HEMATOCRIT: 33.9 % — AB (ref 36.0–46.0)
Hemoglobin: 11.5 g/dL — ABNORMAL LOW (ref 12.0–15.0)
MCH: 30.9 pg (ref 26.0–34.0)
MCHC: 33.9 g/dL (ref 30.0–36.0)
MCV: 91.1 fL (ref 78.0–100.0)
PLATELETS: 120 10*3/uL — AB (ref 150–400)
RBC: 3.72 MIL/uL — ABNORMAL LOW (ref 3.87–5.11)
RDW: 13.2 % (ref 11.5–15.5)
WBC: 7.4 10*3/uL (ref 4.0–10.5)

## 2016-03-26 LAB — TYPE AND SCREEN
ABO/RH(D): O POS
ANTIBODY SCREEN: NEGATIVE

## 2016-03-26 LAB — OB RESULTS CONSOLE HIV ANTIBODY (ROUTINE TESTING): HIV: NONREACTIVE

## 2016-03-26 LAB — RPR: RPR Ser Ql: NONREACTIVE

## 2016-03-26 LAB — OB RESULTS CONSOLE RPR: RPR: NONREACTIVE

## 2016-03-26 MED ORDER — ONDANSETRON HCL 4 MG PO TABS: 4.0000 mg | ORAL_TABLET | ORAL | Status: DC | PRN

## 2016-03-26 MED ORDER — SIMETHICONE 80 MG PO CHEW: 80.0000 mg | CHEWABLE_TABLET | ORAL | Status: DC | PRN

## 2016-03-26 MED ORDER — PHENYLEPHRINE 40 MCG/ML (10ML) SYRINGE FOR IV PUSH (FOR BLOOD PRESSURE SUPPORT)
80.0000 ug | PREFILLED_SYRINGE | INTRAVENOUS | Status: DC | PRN
Start: 2016-03-26 — End: 2016-03-26
  Filled 2016-03-26: qty 5

## 2016-03-26 MED ORDER — DIPHENHYDRAMINE HCL 25 MG PO CAPS: 25.0000 mg | ORAL_CAPSULE | Freq: Four times a day (QID) | ORAL | Status: DC | PRN

## 2016-03-26 MED ORDER — SENNOSIDES-DOCUSATE SODIUM 8.6-50 MG PO TABS
2.0000 | ORAL_TABLET | ORAL | Status: DC
  Administered 2016-03-27 – 2016-03-28 (×2): 2 via ORAL
  Filled 2016-03-26 (×2): qty 2

## 2016-03-26 MED ORDER — LACTATED RINGERS IV SOLN
INTRAVENOUS | Status: DC
  Administered 2016-03-26 (×2): via INTRAVENOUS

## 2016-03-26 MED ORDER — EPHEDRINE 5 MG/ML INJ
10.0000 mg | INTRAVENOUS | Status: DC | PRN
Start: 2016-03-26 — End: 2016-03-26
  Filled 2016-03-26: qty 4

## 2016-03-26 MED ORDER — OXYCODONE-ACETAMINOPHEN 5-325 MG PO TABS: 2.0000 | ORAL_TABLET | ORAL | Status: DC | PRN

## 2016-03-26 MED ORDER — DIPHENHYDRAMINE HCL 50 MG/ML IJ SOLN: 12.5000 mg | INTRAMUSCULAR | Status: DC | PRN

## 2016-03-26 MED ORDER — BENZOCAINE-MENTHOL 20-0.5 % EX AERO: 1.0000 "application " | INHALATION_SPRAY | CUTANEOUS | Status: DC | PRN

## 2016-03-26 MED ORDER — DIBUCAINE 1 % RE OINT: 1.0000 "application " | TOPICAL_OINTMENT | RECTAL | Status: DC | PRN

## 2016-03-26 MED ORDER — SOD CITRATE-CITRIC ACID 500-334 MG/5ML PO SOLN: 30.0000 mL | ORAL | Status: DC | PRN

## 2016-03-26 MED ORDER — PRENATAL MULTIVITAMIN CH
1.0000 | ORAL_TABLET | Freq: Every day | ORAL | Status: DC
  Administered 2016-03-27: 1 via ORAL
  Filled 2016-03-26: qty 1

## 2016-03-26 MED ORDER — OXYTOCIN 40 UNITS IN LACTATED RINGERS INFUSION - SIMPLE MED
1.0000 m[IU]/min | INTRAVENOUS | Status: DC
  Administered 2016-03-26: 2 m[IU]/min via INTRAVENOUS

## 2016-03-26 MED ORDER — OXYTOCIN BOLUS FROM INFUSION
500.0000 mL | Freq: Once | INTRAVENOUS | Status: AC
  Administered 2016-03-26: 500 mL via INTRAVENOUS

## 2016-03-26 MED ORDER — LIDOCAINE HCL (PF) 1 % IJ SOLN
30.0000 mL | INTRAMUSCULAR | Status: DC | PRN
Start: 2016-03-26 — End: 2016-03-26
  Filled 2016-03-26: qty 30

## 2016-03-26 MED ORDER — FLEET ENEMA 7-19 GM/118ML RE ENEM: 1.0000 | ENEMA | RECTAL | Status: DC | PRN

## 2016-03-26 MED ORDER — OXYTOCIN 40 UNITS IN LACTATED RINGERS INFUSION - SIMPLE MED
2.5000 [IU]/h | INTRAVENOUS | Status: DC
  Filled 2016-03-26: qty 1000

## 2016-03-26 MED ORDER — WITCH HAZEL-GLYCERIN EX PADS: 1.0000 "application " | MEDICATED_PAD | CUTANEOUS | Status: DC | PRN

## 2016-03-26 MED ORDER — LACTATED RINGERS IV SOLN: 500.0000 mL | INTRAVENOUS | Status: DC | PRN

## 2016-03-26 MED ORDER — COCONUT OIL OIL
1.0000 "application " | TOPICAL_OIL | Status: DC | PRN
  Administered 2016-03-27: 1 via TOPICAL
  Filled 2016-03-26: qty 120

## 2016-03-26 MED ORDER — ZOLPIDEM TARTRATE 5 MG PO TABS: 5.0000 mg | ORAL_TABLET | Freq: Every evening | ORAL | Status: DC | PRN

## 2016-03-26 MED ORDER — BUTORPHANOL TARTRATE 1 MG/ML IJ SOLN
1.0000 mg | INTRAMUSCULAR | Status: DC | PRN
  Administered 2016-03-26: 1 mg via INTRAVENOUS
  Filled 2016-03-26: qty 1

## 2016-03-26 MED ORDER — OXYCODONE-ACETAMINOPHEN 5-325 MG PO TABS: 1.0000 | ORAL_TABLET | ORAL | Status: DC | PRN

## 2016-03-26 MED ORDER — TERBUTALINE SULFATE 1 MG/ML IJ SOLN
0.2500 mg | Freq: Once | INTRAMUSCULAR | Status: DC | PRN
  Filled 2016-03-26: qty 1

## 2016-03-26 MED ORDER — ONDANSETRON HCL 4 MG/2ML IJ SOLN: 4.0000 mg | INTRAMUSCULAR | Status: DC | PRN

## 2016-03-26 MED ORDER — EPHEDRINE 5 MG/ML INJ
10.0000 mg | INTRAVENOUS | Status: DC | PRN
  Filled 2016-03-26: qty 4

## 2016-03-26 MED ORDER — LACTATED RINGERS IV SOLN: 500.0000 mL | Freq: Once | INTRAVENOUS | Status: DC

## 2016-03-26 MED ORDER — FENTANYL 2.5 MCG/ML BUPIVACAINE 1/10 % EPIDURAL INFUSION (WH - ANES)
14.0000 mL/h | INTRAMUSCULAR | Status: DC | PRN
  Filled 2016-03-26: qty 100

## 2016-03-26 MED ORDER — ACETAMINOPHEN 325 MG PO TABS: 650.0000 mg | ORAL_TABLET | ORAL | Status: DC | PRN

## 2016-03-26 MED ORDER — IBUPROFEN 600 MG PO TABS
600.0000 mg | ORAL_TABLET | Freq: Four times a day (QID) | ORAL | Status: DC
  Administered 2016-03-26 – 2016-03-28 (×7): 600 mg via ORAL
  Filled 2016-03-26 (×7): qty 1

## 2016-03-26 MED ORDER — ONDANSETRON HCL 4 MG/2ML IJ SOLN
4.0000 mg | Freq: Four times a day (QID) | INTRAMUSCULAR | Status: DC | PRN
Start: 2016-03-26 — End: 2016-03-26

## 2016-03-26 MED ORDER — PHENYLEPHRINE 40 MCG/ML (10ML) SYRINGE FOR IV PUSH (FOR BLOOD PRESSURE SUPPORT)
80.0000 ug | PREFILLED_SYRINGE | INTRAVENOUS | Status: DC | PRN
  Filled 2016-03-26: qty 10
  Filled 2016-03-26: qty 5

## 2016-03-26 NOTE — Anesthesia Pain Management Evaluation Note (Signed)
  CRNA Pain Management Visit Note  Patient: Megan Butler, 40 y.o., female  "Hello I am a member of the anesthesia team at [facility]. We have an anesthesia team available at all times to provide care throughout the hospital, including epidural management and anesthesia for C-section. I don't know your plan for the delivery whether it a natural birth, water birth, IV sedation, nitrous supplementation, doula or epidural, but we want to meet your pain goals."   1.Was your pain managed to your expectations on prior hospitalizations?   Yes   2.What is your expectation for pain management during this hospitalization?     Labor support without medications  3.How can we help you reach that goal? unsure  Record the patient's initial score and the patient's pain goal.   Pain: 0  Pain Goal: 10 [facility] wants you to be able to say your pain was always managed very well.  Casimer Lanius 03/26/2016

## 2016-03-26 NOTE — Progress Notes (Signed)
OB PN:  S: Feeling more painful contractions, IV Stadol for pain management  O: BP 125/68   Pulse 75   Temp 98.1 F (36.7 C) (Oral)   Resp 18   Ht 5' 4"  (1.626 m)   Wt 188 lb (85.3 kg)   LMP 06/22/2015   BMI 32.27 kg/m   FHT: 125bpm, moderate variablity, + accels, no decels Toco: q2-25mn SVE: 4/80-90/-2, IUPC placed   A/P: 40y.o. [id number]@ 370w1dor elective IOL 1. FWB: Cat. I 2. Labor: continue Pit per protocol, IUPC placed for further assessment of contractions and titration of pitocin Pain: continue with IV Stadol, ok for epidural if requested GBS: negative  JeJanyth PupaDO [phone]) [phone])

## 2016-03-26 NOTE — H&P (Signed)
HPI: 40 y/o [id number] @ 62w1destimated gestational age (as dated by LMP c/w 20 week ultrasound) presents for scheduled IOL.   no Leaking of Fluid,   light Vaginal Bleeding,   irregualr Uterine Contractions,  + Fetal Movement.  ROS: n HA, ono epigastric pain, no visual changes.    Pregnancy complicated by: 1) AMA- NIPS showed high risk 22q, s/p MFM consult and amniocentesis-normal FEMALE 2) Uterine size discrepancy: last [place]@ 343w6dEFW 7#12oz (85%)   Prenatal Transfer Tool  Maternal Diabetes: No Genetic Screening: Normal Maternal Ultrasounds/Referrals: Normal Fetal Ultrasounds or other Referrals:  None Maternal Substance Abuse:  No Significant Maternal Medications:  None Significant Maternal Lab Results: Lab values include: Group B Strep negative   PNL:  GBS negative, Rub Immune, Hep B neg, RPR NR, HIV neg, GC/C neg, glucola:normal Hgb 12 Blood type: O positive, antibody negative  Immunizations: Tdap: 01/22/16 Flu: completed  OBHx: FTNSVD x 3, 6#0#35-0#0XFuncomplicated, SAB x 2 PMHx:  none Meds:  PNV,  Allergy:   Allergies  Allergen Reactions  . Penicillins Other (See Comments)    Has patient had a PCN reaction causing immediate rash, facial/tongue/throat swelling, SOB or lightheadedness with hypotension: No Has patient had a PCN reaction causing severe rash involving mucus membranes or skin necrosis: No Has patient had a PCN reaction that required hospitalization No Has patient had a PCN reaction occurring within the last 10 years: No If all of the above answers are "NO", then may proceed with Cephalosporin use.   . Sulfa Antibiotics Rash   SurgHx: none SocHx:   no Tobacco, no  EtOH, no Illicit Drugs  O: BP 11818/29 Pulse 85   Temp 97.6 F (36.4 C) (Oral)   Resp 16   Ht 5' 4"  (1.626 m)   Wt 188 lb (85.3 kg)   LMP 06/22/2015   BMI 32.27 kg/m  Gen. AAOx3, NAD Resp. Normal respiratory effort Abd. Gravid,  no tenderness,  no rigidity,  no guarding Extr.  no edema  B/L , no calf tenderness Neuro. Alert & oriented x 3  FHT: 120 baseline, moderate variability, + accels,  no decels Toco: no contractions SVE: 2/50/-3  BSUS: VTX   Labs: see orders  A/P:  [age].o. [id number] 3925w1dA who presents for elective IOL -FWB:  NICHD Cat I FHTs -Labor: plan for Pit per protocol -Pain management: IV Stadol or epidural if requested -GBS: negative  JenJanyth PupaO [phone]) [phone])

## 2016-03-26 NOTE — Progress Notes (Signed)
OB PN:  S: Pt resting comfortably, contractions are minimal.  O: BP 103/72   Pulse 75   Temp 97.6 F (36.4 C) (Oral)   Resp 18   Ht 5' 4"  (1.626 m)   Wt 188 lb (85.3 kg)   LMP 06/22/2015   BMI 32.27 kg/m   FHT: 130bpm, moderate variablity, + accels, no decels Toco: q2-69mn SVE: 2/60/-3, AROM blood-tinge noted  A/P: 40y.o. [id number]@ 340w1dor elective IOl 1. FWB: Cat. I 2. Labor: continue Pit per protocol, AROM performed Pain: IV or epidural upon request GBS: negative  JeJanyth PupaDO [phone]) [phone])

## 2016-03-27 LAB — CBC
HEMATOCRIT: 31.1 % — AB (ref 36.0–46.0)
Hemoglobin: 10.8 g/dL — ABNORMAL LOW (ref 12.0–15.0)
MCH: 31.5 pg (ref 26.0–34.0)
MCHC: 34.7 g/dL (ref 30.0–36.0)
MCV: 90.7 fL (ref 78.0–100.0)
Platelets: 170 10*3/uL (ref 150–400)
RBC: 3.43 MIL/uL — ABNORMAL LOW (ref 3.87–5.11)
RDW: 13.3 % (ref 11.5–15.5)
WBC: 13.1 10*3/uL — ABNORMAL HIGH (ref 4.0–10.5)

## 2016-03-27 NOTE — Progress Notes (Signed)
MOB was referred for history of depression/anxiety. * Referral screened out by Clinical Social Worker because none of the following criteria appear to apply: ~ History of anxiety/depression during this pregnancy, or of post-partum depression. ~ Diagnosis of anxiety and/or depression within last 3 years OR * MOB's symptoms currently being treated with medication and/or therapy.  MOB was dx in 2007. CSW completed chart review and no indication of MH concerns.   Please contact the Clinical Social Worker if needs arise, or if MOB requests.  Laurey Arrow, MSW, LCSW Clinical Social Work [phone]

## 2016-03-27 NOTE — Progress Notes (Signed)
MOB and FOB expressed frustration over number of hospital related interruptions in room. Parents expressed need for alone time. Baby assessment completed and mothers vital signs taken. Parents were given instructions to call out when nurse needed and sign placed on patients door for no hospital interruptions.

## 2016-03-27 NOTE — Lactation Note (Signed)
This note was copied from a baby's chart. Lactation Consultation Note  Sign on door reads "No hospital staff".  Patient Name: Megan Butler [id number] Date: 03/27/2016     Maternal Data    Feeding Feeding Type: Breast Fed Length of feed: 20 min  LATCH Score/Interventions Latch: Grasps breast easily, tongue down, lips flanged, rhythmical sucking. Intervention(s): Breast massage;Breast compression  Audible Swallowing: A few with stimulation Intervention(s): Skin to skin;Hand expression;Alternate breast massage  Type of Nipple: Everted at rest and after stimulation  Comfort (Breast/Nipple): Filling, red/small blisters or bruises, mild/mod discomfort  Problem noted: Mild/Moderate discomfort Interventions (Mild/moderate discomfort): Hand massage;Hand expression  Hold (Positioning): No assistance needed to correctly position infant at breast. Intervention(s): Support Pillows;Position options;Skin to skin  LATCH Score: 8  Lactation Tools Discussed/Used     Consult Status      Vivianne Master [facility] 03/27/2016, 4:43 PM

## 2016-03-27 NOTE — Lactation Note (Signed)
This note was copied from a baby's chart. Lactation Consultation Note Experienced BF mom BF 2 of her older children for 6 months each, the oldest now 42 yrs old, and her youngest now 42 yrs old, BF almost a yr. Mom stated this baby doesn't know how to latch well yet. Mom has pendulum breast w/good everted nipples at the posterior end. Assisted in latching in football hold. Baby latched well w/assistance and sand which hold. Baby BF well. Mom encouraged to feed baby 8-12 times/24 hours and with feeding cues. Referred to Baby and Me Book in Breastfeeding section Pg. 22-23 for position options and Proper latch demonstration. Educated about newborn behavior, STS, I&O, Cluster feeding,supply and demand. [place] brochure given w/resources, support groups and [place] services. Patient Name: Megan Butler Date: 03/27/2016 Reason for consult: Initial assessment   Maternal Data    Feeding    LATCH Score/Interventions                      Lactation Tools Discussed/Used     Consult Status Consult Status: Follow-up Date: 03/27/16 (in pm) Follow-up type: In-patient    Theodoro Kalata 03/27/2016, 5:46 AM

## 2016-03-27 NOTE — Progress Notes (Signed)
Postpartum Note Day # 1  S:  Patient resting comfortable in bed.  Pain controlled.  Tolerating general diet. + flatus, no BM.  Lochia minimal.  Ambulating without difficulty.  She denies n/v/f/c, SOB, or CP.  Pt plans on breastfeeding.  O: Temp:  [97.6 F (36.4 C)-98.4 F (36.9 C)] 98.4 F (36.9 C) (11/16 0612) Pulse Rate:  [63-127] 70 (11/16 0612) Resp:  [16-20] 18 (11/16 0612) BP: (93-135)/(57-83) 116/69 (11/16 0612) Weight:  [188 lb (85.3 kg)] 188 lb (85.3 kg) (11/15 0743)   Gen: A&Ox3, NAD CV: RRR Resp: CTAB Abdomen: soft, NT, ND  Uterus: firm, non-tender, below umbilicus Ext: No edema, no calf tenderness bilaterally, SCDs in place  Labs:  Recent Labs  03/26/16 0745 03/27/16 0604  HGB 11.5* 10.8*    A/P: Pt is a 40 y.o. [id number] s/p NSVD, PPD#1  - Pain well controlled -GU: UOP is adequate -GI: Tolerating general diet -Activity: encouraged sitting up to chair and ambulation as tolerated -Prophylaxis: early ambulation -Labs: stable as above  DISPO: Continue with routine postpartum care, plan for discharge home tomorrow.  Janyth Pupa, DO [phone] (pager) [phone] (office)

## 2016-03-28 MED ORDER — IBUPROFEN 600 MG PO TABS: 600.0000 mg | ORAL_TABLET | Freq: Four times a day (QID) | ORAL | 0 refills | Status: DC

## 2016-03-28 NOTE — Discharge Instructions (Signed)
Home Care Instructions for Mom Introduction  ACTIVITY  Gradually return to your regular activities.  Let yourself rest. Nap while your baby sleeps.  Avoid lifting anything that is heavier than 10 lb (4.5 kg) until your health care provider says it is okay.  Avoid activities that take a lot of effort and energy (are strenuous) until approved by your health care provider. Walking at a slow-to-moderate pace is usually safe.  If you had a cesarean delivery:  Do not vacuum, climb stairs, or drive a car for 4-6 weeks.  Have someone help you at home until you feel like you can do your usual activities yourself.  Do exercises as told by your health care provider, if this applies. VAGINAL BLEEDING You may continue to bleed for 4-6 weeks after delivery. Over time, the amount of blood usually decreases and the color of the blood usually gets lighter. However, the flow of bright red blood may increase if you have been too active. If you need to use more than one pad in an hour because your pad gets soaked, or if you pass a large clot:  Lie down.  Raise your feet.  Place a cold compress on your lower abdomen.  Rest.  Call your health care provider. If you are breastfeeding, your period should return anytime between 8 weeks after delivery and the time that you stop breastfeeding. If you are not breastfeeding, your period should return 6-8 weeks after delivery. PERINEAL CARE The perineal area, or perineum, is the part of your body between your thighs. After delivery, this area needs special care. Follow these instructions as told by your health care provider.  Take warm tub baths for 15-20 minutes.  Use medicated pads and pain-relieving sprays and creams as told.  Do not use tampons or douches until vaginal bleeding has stopped.  Each time you go to the bathroom:  Use a peri bottle.  Change your pad.  Use towelettes in place of toilet paper until your stitches have healed.  Do Kegel  exercises every day. Kegel exercises help to maintain the muscles that support the vagina, bladder, and bowels. You can do these exercises while you are standing, sitting, or lying down. To do Kegel exercises:  Tighten the muscles of your abdomen and the muscles that surround your birth canal.  Hold for a few seconds.  Relax.  Repeat until you have done this 5 times in a row.  To prevent hemorrhoids from developing or getting worse:  Drink enough fluid to keep your urine clear or pale yellow.  Avoid straining when having a bowel movement.  Take over-the-counter medicines and stool softeners as told by your health care provider. BREAST CARE  Wear a tight-fitting bra.  Avoid taking over-the-counter pain medicine for breast discomfort.  Apply ice to the breasts to help with discomfort as needed:  Put ice in a plastic bag.  Place a towel between your skin and the bag.  Leave the ice on for 20 minutes or as told by your health care provider. NUTRITION  Eat a well-balanced diet.  Do not try to lose weight quickly by cutting back on calories.  Take your prenatal vitamins until your postpartum checkup or until your health care provider tells you to stop. POSTPARTUM DEPRESSION You may find yourself crying for no apparent reason and unable to cope with all of the changes that come with having a newborn. This mood is called postpartum depression. Postpartum depression happens because your hormone levels change after  delivery. If you have postpartum depression, get support from your partner, friends, and family. If the depression does not go away on its own after several weeks, contact your health care provider. BREAST SELF-EXAM Do a breast self-exam each month, at the same time of the month. If you are breastfeeding, check your breasts just after a feeding, when your breasts are less full. If you are breastfeeding and your period has started, check your breasts on day 5, 6, or 7 of your  period. Report any lumps, bumps, or discharge to your health care provider. Know that breasts are normally lumpy if you are breastfeeding. This is temporary, and it is not a health risk. INTIMACY AND SEXUALITY Avoid sexual activity for at least 3-4 weeks after delivery or until the brownish-red vaginal flow is completely gone. If you want to avoid pregnancy, use some form of birth control. You can get pregnant after delivery, even if you have not had your period. SEEK MEDICAL CARE IF:  You feel unable to cope with the changes that a child brings to your life, and these feelings do not go away after several weeks.  You notice a lump, a bump, or discharge on your breast. SEEK IMMEDIATE MEDICAL CARE IF:  Blood soaks your pad in 1 hour or less.  You have:  Severe pain or cramping in your lower abdomen.  A bad-smelling vaginal discharge.  A fever that is not controlled by medicine.  A fever, and an area of your breast is red and sore.  Pain or redness in your calf.  Sudden, severe chest pain.  Shortness of breath.  Painful or bloody urination.  Problems with your vision.  You vomit for 12 hours or longer.  You develop a severe headache.  You have serious thoughts about hurting yourself, your child, or anyone else. This information is not intended to replace advice given to you by your health care provider. Make sure you discuss any questions you have with your health care provider. Document Released: 04/25/2000 Document Revised: 10/04/2015 Document Reviewed: 10/30/2014  2017 Elsevier

## 2016-03-28 NOTE — Lactation Note (Signed)
This note was copied from a baby's chart. Lactation Consultation Note: Mom reports nipples are tender. Reports he is learning and getting better at nursing Reports breasts are feeling a little fuller this morning. Baby with pacifier in his mother. Suggested latching baby but mom states she will try again before she goes home. Does not want assist at this time. Reviewed our phone number, OP appointments and [facility]FS[facility] as resources for support after DC. To call prn  Patient Name: Megan Butler [id number] Date: 03/28/2016 Reason for consult: Follow-up assessment;Late preterm infant   Maternal Data Formula Feeding for Exclusion: No Has patient been taught Hand Expression?: Yes Does the patient have breastfeeding experience prior to this delivery?: Yes  Feeding Feeding Type: Breast Fed Length of feed: 10 min  LATCH Score/Interventions                      Lactation Tools Discussed/Used     Consult Status Consult Status: Complete    Truddie Crumble 03/28/2016, 9:15 AM

## 2016-03-28 NOTE — Discharge Summary (Signed)
OB Discharge Summary     Patient Name: Megan Butler DOB: 1975-09-27 MRN: [id number]  Date of admission: 03/26/2016 Delivering MD: Janyth Pupa   Date of discharge: 03/28/2016  Admitting diagnosis: INDUCTION Intrauterine pregnancy: [redacted]w[redacted]d    Secondary diagnosis:  Active Problems:   Normal pregnancy  Additional problems: none     Discharge diagnosis: Term Pregnancy Delivered                                                                                                Post partum procedures:n/a  Augmentation: AROM and Pitocin  Complications: None  Hospital course:  Induction of Labor With Vaginal Delivery   40y.o. yo [id number] admitted to the hospital 03/26/2016 for induction of labor.  Indication for induction: Favorable cervix at term.  Patient had an uncomplicated labor course as follows: Membrane Rupture Time/Date: 12:29 PM ,03/26/2016   Intrapartum Procedures: Episiotomy: Median [2];None [1]                                         Lacerations:  None [1]  Patient had delivery of a Viable infant.  Information for the patient's newborn:  CHDIXIE, COPPA0[[id number]]Delivery Method: Vaginal, Spontaneous Delivery (Filed from Delivery Summary)   03/26/2016  Details of delivery can be found in separate delivery note.  Patient had a routine postpartum course. Patient is discharged home 03/28/16.   Physical exam Vitals:   03/27/16 0230 03/27/16 0612 03/27/16 1629 03/28/16 0600  BP: (!) 93/57 116/69 104/66 108/68  Pulse: 63 70 71 65  Resp: 16 18 18 18   Temp: 97.9 F (36.6 C) 98.4 F (36.9 C) 97.6 F (36.4 C) 98.2 F (36.8 C)  TempSrc: Oral Oral Oral Oral  SpO2:   98%   Weight:      Height:       General: alert, cooperative and no distress Lochia: appropriate Uterine Fundus: firm Incision: N/A DVT Evaluation: No evidence of DVT seen on physical exam. Labs: Lab Results  Component Value Date   WBC 13.1 (H) 03/27/2016   HGB 10.8 (L)  03/27/2016   HCT 31.1 (L) 03/27/2016   MCV 90.7 03/27/2016   PLT 170 03/27/2016   CMP Latest Ref Rng & Units 04/22/2011  Glucose 70 - 99 mg/dL 85  BUN 6 - 23 mg/dL 18  Creatinine 0.50 - 1.10 mg/dL 0.60  Sodium 135 - 145 mEq/L 144  Potassium 3.5 - 5.1 mEq/L 3.5  Chloride 96 - 112 mEq/L 108    Discharge instruction: per After Visit Summary and "Baby and Me Booklet".  After visit meds:    Medication List    TAKE these medications   acetaminophen 500 MG tablet Commonly known as:  TYLENOL Take 1,000 mg by mouth every 6 (six) hours as needed for mild pain or headache.   ibuprofen 600 MG tablet Commonly known as:  ADVIL,MOTRIN Take 1 tablet (600 mg total) by mouth every 6 (six) hours.   prenatal multivitamin  Tabs tablet Take 1 tablet by mouth at bedtime.       Diet: routine diet  Activity: Advance as tolerated. Pelvic rest for 6 weeks.   Outpatient follow up:6 weeks Follow up Appt:No future appointments. Follow up Visit:No Follow-up on file.  Postpartum contraception: Vasectomy  Newborn Data: Live born female  Birth Weight: 7 lb 9.3 oz (3440 g) APGAR: 9, 9  Baby Feeding: Breast Disposition:home with mother   03/28/2016 Janyth Pupa, M, DO

## 2016-03-28 NOTE — Progress Notes (Signed)
Postpartum Note Day # 2  S:  Patient resting comfortable in bed.  Pain controlled.  Tolerating general diet. + flatus, no BM.  Lochia minimal.  Ambulating without difficulty.  She denies n/v/f/c, SOB, or CP.  Pt plans on breastfeeding.  O: Temp:  [97.6 F (36.4 C)] 97.6 F (36.4 C) (11/16 1629) Pulse Rate:  [71] 71 (11/16 1629) Resp:  [18] 18 (11/16 1629) BP: (104)/(66) 104/66 (11/16 1629) SpO2:  [98 %] 98 % (11/16 1629)   Gen: A&Ox3, NAD CV: Regular rate Resp: Normal respiratory effort and rate Abdomen: soft, NT, ND  Uterus: firm, non-tender, below umbilicus Ext: Minimal pedal edema bilaterally,  no calf tenderness bilaterally Labs:   Recent Labs  03/26/16 0745 03/27/16 0604  HGB 11.5* 10.8*    A/P: Pt is a 40 y.o. [id number] s/p NSVD, PPD#2  - Pain well controlled -GU: Voiding freely -GI: Tolerating general diet -Activity: encouraged sitting up to chair and ambulation as tolerated -Prophylaxis: early ambulation -Labs: stable as above  DISPO: Plan for discharge home today, meeting postpartum milestones appropriately.  Janyth Pupa, DO [phone] (pager) [phone] (office)

## 2016-04-09 ENCOUNTER — Encounter: Payer: Self-pay | Admitting: Family Medicine

## 2016-04-09 ENCOUNTER — Ambulatory Visit (INDEPENDENT_AMBULATORY_CARE_PROVIDER_SITE_OTHER): Payer: BLUE CROSS/BLUE SHIELD | Admitting: Family Medicine

## 2016-04-09 VITALS — BP 100/58 | HR 68 | Ht 64.5 in | Wt 176.8 lb

## 2016-04-09 DIAGNOSIS — L980 Pyogenic granuloma: Secondary | ICD-10-CM

## 2016-04-09 NOTE — Progress Notes (Signed)
Chief Complaint  Patient presents with  . Mass    left middle finger growth x 2 months.    Patient presents with complaint of mass on her left middle finger. She reports that it started as a prominent blood vessel, that she messed with in early October. It seemed to heal up, then scab came off, but has noted a gradually enlarging mass that is friable and slightly tender.  Tried using a steri-strip to see if that would stop the bleeding, and that is when the mass started within the original scab. Inferior portion is growing over the skin.  She is 2 weeks post-partum. Gave birth to a healthy boy, who is doing well.  She denies any significant postpartum depression. 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  PMH, PSH, SH reviewed and updated  Outpatient Encounter Prescriptions as of 04/09/2016  Medication Sig  . Prenatal Vit-Fe Fumarate-FA (PRENATAL MULTIVITAMIN) TABS tablet Take 1 tablet by mouth at bedtime.   Marland Kitchen acetaminophen (TYLENOL) 500 MG tablet Take 1,000 mg by mouth every 6 (six) hours as needed for mild pain or headache.  . ibuprofen (ADVIL,MOTRIN) 600 MG tablet Take 1 tablet (600 mg total) by mouth every 6 (six) hours. (Patient not taking: Reported on 04/09/2016)   No facility-administered encounter medications on file as of 04/09/2016.    Allergies  Allergen Reactions  . Penicillins Other (See Comments)    Has patient had a PCN reaction causing immediate rash, facial/tongue/throat swelling, SOB or lightheadedness with hypotension: No Has patient had a PCN reaction causing severe rash involving mucus membranes or skin necrosis: No Has patient had a PCN reaction that required hospitalization No Has patient had a PCN reaction occurring within the last 10 years: No If all of the above answers are "NO", then may proceed with Cephalosporin use.   . Sulfa Antibiotics Rash    ROS: no fever, chills, nausea, vomiting, diarrhea, other skin concerns/bleeding/bruising (just chief  complaint).  Denies depression.  Nursing without problems.  PHYSICAL EXAM:  BP (!) 100/58 (BP Location: Right Arm, Patient Position: Sitting, Cuff Size: Normal)   Pulse 68   Ht 5' 4.5" (1.638 m)   Wt 176 lb 12.8 oz (80.2 kg)   LMP  (LMP Unknown)   Breastfeeding? Yes   BMI 29.88 kg/m   Well appearing, pleasant female in no distress Left 3rd finger 1 x 0.5 cm pink tissue mass that is overlying the distal aspect of the distal phalanx of the left 3rd finger, slightly radial Hyperkeratotic on superior protion, rest is soft. No fluctuance Distal phalanx is minimally swollen, very slightly violaceous. Brisk capillary refill  ASSESSMENT/PLAN:  Pyogenic granuloma of skin - Plan: Ambulatory referral to Dermatology  Hormones from pregnancy like contributes.  Given location, she prefers treatment. Referred to derm.

## 2016-04-09 NOTE — Patient Instructions (Signed)
Pyogenic Granuloma Pyogenic granuloma is a growth (lesion) that forms on the skin or on the mucous membranes of the mouth. This type of growth is a lump of very red tissue that bleeds easily. A pyogenic granuloma is usually a single lesion that most often affects:  The head and neck.  The mucous membranes of the mouth or tongue.  The upper body.  The hands and feet. A pyogenic granuloma usually measures about 0.5 inch (1.3 cm), but lesions can be smaller or larger. This condition does not spread from person to person (is not contagious). The lesion is not cancerous (benign). What are the causes? A pyogenic granuloma results from a reaction of your skin or mucous membranes. The reaction causes a mound of tiny blood vessels (capillaries) to form a lesion. This often happens after a minor injury, like pricking your skin or biting your lip or tongue. Sometimes it occurs without an injury. The exact cause of the reaction is not known. What increases the risk? The condition is more likely to develop in:  Pregnant women.  Children and young adults.  People who take certain medicines, especially acne treatment drugs, birth control pills, and some medicines used to treat cancer or HIV/AIDS. What are the signs or symptoms? The main symptom of this condition is a raised or lumpy lesion that is very red. The lesion may also:  Have a crusty, ulcerated surface.  Bleed easily.  Be slightly sore. How is this diagnosed? This condition is diagnosed based on your symptoms and medical history, especially if you recently had an injury. Your health care provider will also do a physical exam. Your health care provider may remove a small piece of the granuloma for testing (biopsy) to rule out cancer. How is this treated? A small lesion may go away without treatment. You may have to stop or change any medicines that caused the lesion. Pyogenic granulomas caused by pregnancy usually go away after delivery. If  your legion is large, irritated, or bleeds easily, you may need to have the lesion removed. This may involve:  Scraping away the lesion (curettage).  Using chemicals or electric energy to destroy the lesion.  Removing the lesion along with a small piece of normal skin or mucous membrane (surgical excision). This is the best treatment to prevent the lesion from coming back. Follow these instructions at home:  Take over-the-counter and prescription medicines only as told by your health care provider.  Keep all follow-up visits as told by your health care provider. This is important. Contact a health care provider if:  You have a fever.  Your lesion bleeds.  Your lesion comes back after treatment. This information is not intended to replace advice given to you by your health care provider. Make sure you discuss any questions you have with your health care provider. Document Released: 05/13/2015 Document Revised: 10/04/2015 Document Reviewed: 05/13/2015 Elsevier Interactive Patient Education  2017 [employer].

## 2016-04-23 ENCOUNTER — Other Ambulatory Visit: Payer: Self-pay | Admitting: Dermatology

## 2016-04-23 DIAGNOSIS — L980 Pyogenic granuloma: Secondary | ICD-10-CM | POA: Diagnosis not present

## 2016-04-23 DIAGNOSIS — D485 Neoplasm of uncertain behavior of skin: Secondary | ICD-10-CM | POA: Diagnosis not present

## 2016-04-30 ENCOUNTER — Encounter: Payer: Self-pay | Admitting: Family Medicine

## 2016-04-30 MED ORDER — METOCLOPRAMIDE HCL 10 MG PO TABS
10.0000 mg | ORAL_TABLET | Freq: Three times a day (TID) | ORAL | 0 refills | Status: DC
Start: 2016-04-30 — End: 2016-06-12

## 2016-05-08 ENCOUNTER — Other Ambulatory Visit: Payer: Self-pay | Admitting: Obstetrics & Gynecology

## 2016-05-08 ENCOUNTER — Other Ambulatory Visit (HOSPITAL_COMMUNITY)
Admission: RE | Admit: 2016-05-08 | Discharge: 2016-05-08 | Disposition: A | Discharge status: Home / Self Care | Payer: BLUE CROSS/BLUE SHIELD | Source: Ambulatory Visit | Attending: Obstetrics & Gynecology | Admitting: Obstetrics & Gynecology

## 2016-05-08 DIAGNOSIS — Z01419 Encounter for gynecological examination (general) (routine) without abnormal findings: Secondary | ICD-10-CM | POA: Insufficient documentation

## 2016-05-08 DIAGNOSIS — Z1151 Encounter for screening for human papillomavirus (HPV): Secondary | ICD-10-CM | POA: Diagnosis not present

## 2016-05-21 LAB — CYTOLOGY - PAP
DIAGNOSIS: NEGATIVE
HPV (WINDOPATH): NOT DETECTED

## 2016-06-12 ENCOUNTER — Other Ambulatory Visit: Payer: Self-pay | Admitting: Family Medicine

## 2016-06-12 NOTE — Telephone Encounter (Signed)
Is this okay to refill? 

## 2016-07-24 ENCOUNTER — Other Ambulatory Visit: Payer: Self-pay | Admitting: Family Medicine

## 2016-07-25 NOTE — Telephone Encounter (Signed)
Is this okay to refill? 

## 2016-08-25 ENCOUNTER — Other Ambulatory Visit: Payer: Self-pay | Admitting: Family Medicine

## 2016-08-25 NOTE — Telephone Encounter (Signed)
Is this okay to refill? 

## 2016-09-22 ENCOUNTER — Other Ambulatory Visit: Payer: Self-pay | Admitting: Family Medicine

## 2016-09-22 NOTE — Telephone Encounter (Signed)
Is this okay to refill? 

## 2016-10-07 DIAGNOSIS — S61011A Laceration without foreign body of right thumb without damage to nail, initial encounter: Secondary | ICD-10-CM | POA: Diagnosis not present

## 2016-12-03 ENCOUNTER — Other Ambulatory Visit: Payer: Self-pay | Admitting: Family Medicine

## 2016-12-03 NOTE — Telephone Encounter (Signed)
Is this okay to refill? Looks like pt was seen for an acute visit in 03/2016 but nothing since and nothing scheduled.

## 2017-01-07 ENCOUNTER — Encounter: Payer: Self-pay | Admitting: Family Medicine

## 2017-01-07 ENCOUNTER — Ambulatory Visit (INDEPENDENT_AMBULATORY_CARE_PROVIDER_SITE_OTHER): Payer: BLUE CROSS/BLUE SHIELD | Admitting: Family Medicine

## 2017-01-07 VITALS — BP 110/70 | HR 64 | Ht 64.5 in | Wt 174.8 lb

## 2017-01-07 DIAGNOSIS — F330 Major depressive disorder, recurrent, mild: Secondary | ICD-10-CM | POA: Diagnosis not present

## 2017-01-07 DIAGNOSIS — M25511 Pain in right shoulder: Secondary | ICD-10-CM

## 2017-01-07 DIAGNOSIS — E663 Overweight: Secondary | ICD-10-CM | POA: Diagnosis not present

## 2017-01-07 MED ORDER — MELOXICAM 15 MG PO TABS: ORAL_TABLET | ORAL | 0 refills | Status: DC

## 2017-01-07 MED ORDER — FLUOXETINE HCL 10 MG PO CAPS: 10.0000 mg | ORAL_CAPSULE | Freq: Every day | ORAL | 1 refills | Status: DC

## 2017-01-07 MED ORDER — BUPROPION HCL ER (XL) 150 MG PO TB24: 150.0000 mg | ORAL_TABLET | Freq: Every day | ORAL | 1 refills | Status: DC

## 2017-01-07 MED ORDER — PHENTERMINE HCL 37.5 MG PO CAPS: 37.5000 mg | ORAL_CAPSULE | ORAL | 2 refills | Status: DC

## 2017-01-07 NOTE — Progress Notes (Signed)
Chief Complaint  Patient presents with  . Shoulder Pain    right shoulder pain x 2 months, unsure of what she did and it is getting progressively worse. Has tried naproxen and ice.   . Other    would like to stop breastfeeding and get back on wellbutrin.   . Other    would also like to possibly get back on phentermine.    Patient presents to f/u on her depression. Prior to pregnancy had been doing well on Wellbutrin alone.  (in distant past had also been on cymbalta).  She was changed to sertraline postpartum (not taking any medication during pregnancy).  32m was helpful, but she cut back to 267ma month ago due to sedation.  This dose isn't as effective in treating her moods, but sedation is improved.  Irritability is her main symptom.  Sometimes feels down, nothing to look forward to.  "cranky". Crankiness/irritability is causing some issues in the marriage. Baby is 9 months, feeding only at night.  She hopes to breastfeed for a year, and wants wellbutrin to have on hand for when she stops. She thinks her husband would prefer that she stop nursing now, and start meds sooner.  She is nursing at night only, reports [employer] up every 2 hours at night, and that her husband sleeps through it.  She recalls that lexapro was just as sedating as sertraline has been.  She has had only had 3 migraines since early pregnancy--none since she hasn't had a cycle. She previously took topirimate for migraine prevention (and weight loss).  She worries some that her migraines will recur once cycles resume after she stops nursing.  She is "eating all the time", not necessarily out of hunger.  Eating the kid-friendly foods in the house (chicken fingers, spaghetti) rather than better food choices, and also having a lot of the junk that is in the house for the kids.  She was able to choose better things, better willpower when on phentermine in the past.  Unsure if nursing is driving part of her hunger.  She is asking  for a prescription for phentermine, to restart once she stops nursing. Trying to do [employer] with her nurse at work), does okay while at work, too hard to follow at home.  Started walking with [employer] other week (when she doesn't have the other kids), not getting regular exercise the weeks they have all the kids.  Right shoulder pain x 2 months--the arm that is raised in bed (laying on her back or her right side, with arm elevated with baby next to her while nursing, sleeping.  Pain started 2 months ago, progressively worse. Can no longer reach back to fasten her bra. +impingment. Denies weakness.  Some aching in front of right shoulder. No pain with most daily activities. Took naprosyn 50019mID for about a month--helped while taking, but didn't fix the problem. Cut back to more sporadic use just over the last week.  PMH, PSH, SH reviewed  Meds: sertraline 103m69mily, reglan 10mg19m (decreased from TID, weaning off), prenatal vitamin  Allergies  Allergen Reactions  . Penicillins Other (See Comments)    Has patient had a PCN reaction causing immediate rash, facial/tongue/throat swelling, SOB or lightheadedness with hypotension: No Has patient had a PCN reaction causing severe rash involving mucus membranes or skin necrosis: No Has patient had a PCN reaction that required hospitalization No Has patient had a PCN reaction occurring within the last 10 years:  No If all of the above answers are "NO", then may proceed with Cephalosporin use.   . Sulfa Antibiotics Rash    ROS:  No fever, chills, URI symptoms, headaches, dizziness, chest pain, palpitations, shortness of breath, GI or GU complaints.  Decreased libido, depression/irritability and right shoulder pain per HPI.  PHYSICAL EXAM:  BP 110/70 (BP Location: Left Arm, Patient Position: Sitting, Cuff Size: Normal)   Pulse 64   Ht 5' 4.5" (1.638 m)   Wt 174 lb 12.8 oz (79.3 kg)   Breastfeeding? Yes   BMI 29.54 kg/m   Wt  Readings from Last 3 Encounters:  01/07/17 174 lb 12.8 oz (79.3 kg)  04/09/16 176 lb 12.8 oz (80.2 kg)  03/26/16 188 lb (85.3 kg)   Well appearing, pleasant female in no distress (mild-mod discomfort when started moving her right arm during exam only) HEENT: conjunctiva and sclera are clear, EOMI Neck: no lymphadenopathy, thyromegaly or mass Heart: regular rate and rhythm Lungs: clear bilaterally Abdomen: soft, nontender, no mass Back: no spinal or CVA tenderness Extremities: FROM of shoulders, only slight discomfort on the right. She has pain with internal rotation of the right shoulder (reach arm behind her) and with internal rotation against resistance.  Normal strength. +impingement. Focal area of mild tenderness antero laterally on shoulder Psych: normal mood, affect, hygiene, grooming, eye contact, speech.  PHQ9 score of 6 Neuro: alert and oriented, cranial nerves intact, normal strength, sensation, DTR's   ASSESSMENT/PLAN:  Acute pain of right shoulder - x2 mos, with impingment, didn't respond to naproxen; declines cortisone shot, prefers to try different NSAID. Consider cortisone, PT/chiro if not better - Plan: meloxicam (MOBIC) 15 MG tablet  Depression, major, recurrent, mild (HCC) - risks/side effects and lactation risks reviewed. Sertraline too sedating; consider fluoxetine (more passed in milk); prefers to wait for wellbutrin until weaned - Plan: FLUoxetine (PROZAC) 10 MG capsule, buPROPion (WELLBUTRIN XL) 150 MG 24 hr tablet  Overweight (BMI 25.0-29.9) - significant counseling re: diet, exercise, adequate sleep, heallthy food choices, tracking ([employer], [employer]) for accountability. Phentermine after d/c nursing - Plan: phentermine 37.5 MG capsule  Lactation studies/risks reviewed--fluoxetine and wellbutrin are higher risk than sertraline, but tolerable dose of sertraline isn't effective. Nursing just in the evening, so overall amount passed to baby isn't very large, and likely safe.  She will research and decide if she wants to switch. Originally she wanted to hold off on starting Wellbutrin until she weans from nursing. Written rx given for Wellbutrin.  Prozac was sent to pharmacy--recommend starting low at 68m, which is still likely going to be more effective than the 260msertraline.  Can increase to 2031mf needed.   Lactation risks with meloxicam also reviewed. Other alternative options reviewed, including cortisone injection, PT/chiro with ART. F/u in a few weeks if not better.  May need topamax added if migraines recur once cycles resume. Phentermine rx'd for when she stops nursing.  1 hour visit time, more than 1/2 spent counseling (medications, exercises, side effects, diet, exercise, etc)    Stop the sertraline and change to fluoxetine.  We will start with just low dose, 66m82mou may increase to 20mg54mer 2-4 week if needed, vs seeing how 66mg 74ms for you. Once you stop nursing, you can add in the 150mg o37mllbutrin XL, and after 1-2 weeks increase to 300mg.  55mcan decide if you want to stop the prozac at that time (which self-tapers) vs overlap for a bit. If you are having ongoing  libido issues, getting off the prozac and on full dose of wellbutrin earlier would be the better plan.

## 2017-01-07 NOTE — Patient Instructions (Addendum)
Stop the sertraline and change to fluoxetine.  We will start with just low dose, 63m. You may increase to 235mafter 2-4 week if needed, vs seeing how 1018morks for you. Once you stop nursing, you can add in the 150m24m Wellbutrin XL, and after 1-2 weeks increase to 300mg22mou can decide if you want to stop the prozac at that time (which self-tapers) vs overlap for a bit. If you are having ongoing libido issues, getting off the prozac and on full dose of wellbutrin earlier would be the better plan.  Do not start the wellbutrin or the phentermine until you have weaned from nursing.   We reviewed the lactation recommendations of the medications.  All of them have some risk and excretion into the milk.  You are nursing small amounts (at night only), so the risk is less than to a younger infant.  Question would be whether to change to prozac, which is excreted more than the sertraline, vs just switching directly to Wellbutrin (actually came up as a little safer than prozac).  Feel free to research [facility] you may decide not to start any of these until you stop nursing.  I do think that sleep deprivation is contributing to irritability. Having some help at night so you can get less frequent interruptions in sleep might help your moods some.  Getting regular exercise and other forms of relaxation (and again, more sleep) are recommended as well.   Stop the naproxen. Instead, take meloxicam once daily for up to 2 weeks. Try and avoid sleeping with your arm overhead. If you aren't improving, return for cortisone injection (vs call for referral to PT vs chiropractor--ie Dr. JeremJohnston EbbsarenSantiago GladPC).[facility] your prescribed anti-inflammatory medication with food; discontinue or cut back the dose if you develop stomach pain/discomfort/side effects.  Do not take other over-the-counter pain medications such as ibuprofen, advil, motrin, aleve, naproxen, Goody or BC Powder at the same time.  Do not use  longer than recommended.  It is okay to use acetaminophen (tylenol) along with this medication.     Shoulder Exercises Ask your health care provider which exercises are safe for you. Do exercises exactly as told by your health care provider and adjust them as directed. It is normal to feel mild stretching, pulling, tightness, or discomfort as you do these exercises, but you should stop right away if you feel sudden pain or your pain gets worse.Do not begin these exercises until told by your health care provider. RANGE OF MOTION EXERCISES These exercises warm up your muscles and joints and improve the movement and flexibility of your shoulder. These exercises also help to relieve pain, numbness, and tingling. These exercises involve stretching your injured shoulder directly. Exercise A: Pendulum  1. Stand near a wall or a surface that you can hold onto for balance. 2. Bend at the waist and let your left / right arm hang straight down. Use your other arm to support you. Keep your back straight and do not lock your knees. 3. Relax your left / right arm and shoulder muscles, and move your hips and your trunk so your left / right arm swings freely. Your arm should swing because of the motion of your body, not because you are using your arm or shoulder muscles. 4. Keep moving your body so your arm swings in the following directions, as told by your health care provider: ? Side to side. ? Forward and backward. ? In  clockwise and counterclockwise circles. 5. Continue each motion for __________ seconds, or for as long as told by your health care provider. 6. Slowly return to the starting position. Repeat __________ times. Complete this exercise __________ times a day. Exercise B:Flexion, Standing  1. Stand and hold a broomstick, a cane, or a similar object. Place your hands a little more than shoulder-width apart on the object. Your left / right hand should be palm-up, and your other hand should be  palm-down. 2. Keep your elbow straight and keep your shoulder muscles relaxed. Push the stick down with your healthy arm to raise your left / right arm in front of your body, and then over your head until you feel a stretch in your shoulder. ? Avoid shrugging your shoulder while you raise your arm. Keep your shoulder blade tucked down toward the middle of your back. 3. Hold for __________ seconds. 4. Slowly return to the starting position. Repeat __________ times. Complete this exercise __________ times a day. Exercise C: Abduction, Standing 1. Stand and hold a broomstick, a cane, or a similar object. Place your hands a little more than shoulder-width apart on the object. Your left / right hand should be palm-up, and your other hand should be palm-down. 2. While keeping your elbow straight and your shoulder muscles relaxed, push the stick across your body toward your left / right side. Raise your left / right arm to the side of your body and then over your head until you feel a stretch in your shoulder. ? Do not raise your arm above shoulder height, unless your health care provider tells you to do that. ? Avoid shrugging your shoulder while you raise your arm. Keep your shoulder blade tucked down toward the middle of your back. 3. Hold for __________ seconds. 4. Slowly return to the starting position. Repeat __________ times. Complete this exercise __________ times a day. Exercise D:Internal Rotation  1. Place your left / right hand behind your back, palm-up. 2. Use your other hand to dangle an exercise band, a towel, or a similar object over your shoulder. Grasp the band with your left / right hand so you are holding onto both ends. 3. Gently pull up on the band until you feel a stretch in the front of your left / right shoulder. ? Avoid shrugging your shoulder while you raise your arm. Keep your shoulder blade tucked down toward the middle of your back. 4. Hold for __________  seconds. 5. Release the stretch by letting go of the band and lowering your hands. Repeat __________ times. Complete this exercise __________ times a day. STRETCHING EXERCISES These exercises warm up your muscles and joints and improve the movement and flexibility of your shoulder. These exercises also help to relieve pain, numbness, and tingling. These exercises are done using your healthy shoulder to help stretch the muscles of your injured shoulder. Exercise E: Warehouse manager (External Rotation and Abduction)  1. Stand in a doorway with one of your feet slightly in front of the other. This is called a staggered stance. If you cannot reach your forearms to the door frame, stand facing a corner of a room. 2. Choose one of the following positions as told by your health care provider: ? Place your hands and forearms on the door frame above your head. ? Place your hands and forearms on the door frame at the height of your head. ? Place your hands on the door frame at the height of your elbows. 3.  Slowly move your weight onto your front foot until you feel a stretch across your chest and in the front of your shoulders. Keep your head and chest upright and keep your abdominal muscles tight. 4. Hold for __________ seconds. 5. To release the stretch, shift your weight to your back foot. Repeat __________ times. Complete this stretch __________ times a day. Exercise F:Extension, Standing 1. Stand and hold a broomstick, a cane, or a similar object behind your back. ? Your hands should be a little wider than shoulder-width apart. ? Your palms should face away from your back. 2. Keeping your elbows straight and keeping your shoulder muscles relaxed, move the stick away from your body until you feel a stretch in your shoulder. ? Avoid shrugging your shoulders while you move the stick. Keep your shoulder blade tucked down toward the middle of your back. 3. Hold for __________ seconds. 4. Slowly return to  the starting position. Repeat __________ times. Complete this exercise __________ times a day. STRENGTHENING EXERCISES These exercises build strength and endurance in your shoulder. Endurance is the ability to use your muscles for a long time, even after they get tired. Exercise G:External Rotation  1. Sit in a stable chair without armrests. 2. Secure an exercise band at elbow height on your left / right side. 3. Place a soft object, such as a folded towel or a small pillow, between your left / right upper arm and your body to move your elbow a few inches away (about 10 cm) from your side. 4. Hold the end of the band so it is tight and there is no slack. 5. Keeping your elbow pressed against the soft object, move your left / right forearm out, away from your abdomen. Keep your body steady so only your forearm moves. 6. Hold for __________ seconds. 7. Slowly return to the starting position. Repeat __________ times. Complete this exercise __________ times a day. Exercise H:Shoulder Abduction  1. Sit in a stable chair without armrests, or stand. 2. Hold a __________ weight in your left / right hand, or hold an exercise band with both hands. 3. Start with your arms straight down and your left / right palm facing in, toward your body. 4. Slowly lift your left / right hand out to your side. Do not lift your hand above shoulder height unless your health care provider tells you that this is safe. ? Keep your arms straight. ? Avoid shrugging your shoulder while you do this movement. Keep your shoulder blade tucked down toward the middle of your back. 5. Hold for __________ seconds. 6. Slowly lower your arm, and return to the starting position. Repeat __________ times. Complete this exercise __________ times a day. Exercise I:Shoulder Extension 1. Sit in a stable chair without armrests, or stand. 2. Secure an exercise band to a stable object in front of you where it is at shoulder  height. 3. Hold one end of the exercise band in each hand. Your palms should face each other. 4. Straighten your elbows and lift your hands up to shoulder height. 5. Step back, away from the secured end of the exercise band, until the band is tight and there is no slack. 6. Squeeze your shoulder blades together as you pull your hands down to the sides of your thighs. Stop when your hands are straight down by your sides. Do not let your hands go behind your body. 7. Hold for __________ seconds. 8. Slowly return to the starting position. Repeat __________  times. Complete this exercise __________ times a day. Exercise J:Standing Shoulder Row 1. Sit in a stable chair without armrests, or stand. 2. Secure an exercise band to a stable object in front of you so it is at waist height. 3. Hold one end of the exercise band in each hand. Your palms should be in a thumbs-up position. 4. Bend each of your elbows to an "L" shape (about 90 degrees) and keep your upper arms at your sides. 5. Step back until the band is tight and there is no slack. 6. Slowly pull your elbows back behind you. 7. Hold for __________ seconds. 8. Slowly return to the starting position. Repeat __________ times. Complete this exercise __________ times a day. Exercise K:Shoulder Press-Ups  1. Sit in a stable chair that has armrests. Sit upright, with your feet flat on the floor. 2. Put your hands on the armrests so your elbows are bent and your fingers are pointing forward. Your hands should be about even with the sides of your body. 3. Push down on the armrests and use your arms to lift yourself off of the chair. Straighten your elbows and lift yourself up as much as you comfortably can. ? Move your shoulder blades down, and avoid letting your shoulders move up toward your ears. ? Keep your feet on the ground. As you get stronger, your feet should support less of your body weight as you lift yourself up. 4. Hold for __________  seconds. 5. Slowly lower yourself back into the chair. Repeat __________ times. Complete this exercise __________ times a day. Exercise L: Wall Push-Ups  1. Stand so you are facing a stable wall. Your feet should be about one arm-length away from the wall. 2. Lean forward and place your palms on the wall at shoulder height. 3. Keep your feet flat on the floor as you bend your elbows and lean forward toward the wall. 4. Hold for __________ seconds. 5. Straighten your elbows to push yourself back to the starting position. Repeat __________ times. Complete this exercise __________ times a day. This information is not intended to replace advice given to you by your health care provider. Make sure you discuss any questions you have with your health care provider. Document Released: 03/12/2005 Document Revised: 01/21/2016 Document Reviewed: 01/07/2015 Elsevier Interactive Patient Education  2018 [employer].

## 2017-03-10 DIAGNOSIS — Z23 Encounter for immunization: Secondary | ICD-10-CM | POA: Diagnosis not present

## 2017-03-22 ENCOUNTER — Other Ambulatory Visit: Payer: Self-pay | Admitting: Family Medicine

## 2017-03-22 DIAGNOSIS — F330 Major depressive disorder, recurrent, mild: Secondary | ICD-10-CM

## 2017-03-23 ENCOUNTER — Other Ambulatory Visit: Payer: Self-pay | Admitting: *Deleted

## 2017-03-23 MED ORDER — BUPROPION HCL ER (XL) 300 MG PO TB24: 300.0000 mg | ORAL_TABLET | Freq: Every day | ORAL | 0 refills | Status: DC

## 2017-03-23 NOTE — Telephone Encounter (Signed)
Is this okay to refill? 

## 2017-03-23 NOTE — Telephone Encounter (Signed)
Spoke with pt.  She is taking 2/d.  Please change to 333m dose and fill x 3 mos.   She is no longer nursing.  Moods better, still some anxiety.

## 2017-05-18 ENCOUNTER — Other Ambulatory Visit: Payer: Self-pay | Admitting: Family Medicine

## 2017-05-18 NOTE — Telephone Encounter (Signed)
Is this okay to refill? 

## 2017-05-19 NOTE — Telephone Encounter (Signed)
Called pt reached voice mail lmtrc.  Needs ov

## 2017-05-19 NOTE — Telephone Encounter (Signed)
Needs OV.  

## 2017-05-25 ENCOUNTER — Ambulatory Visit (INDEPENDENT_AMBULATORY_CARE_PROVIDER_SITE_OTHER): Payer: BLUE CROSS/BLUE SHIELD | Admitting: Family Medicine

## 2017-05-25 ENCOUNTER — Encounter: Payer: Self-pay | Admitting: Family Medicine

## 2017-05-25 VITALS — BP 118/70 | HR 88 | Ht 64.5 in | Wt 155.4 lb

## 2017-05-25 DIAGNOSIS — F330 Major depressive disorder, recurrent, mild: Secondary | ICD-10-CM | POA: Diagnosis not present

## 2017-05-25 DIAGNOSIS — E663 Overweight: Secondary | ICD-10-CM

## 2017-05-25 DIAGNOSIS — F418 Other specified anxiety disorders: Secondary | ICD-10-CM | POA: Diagnosis not present

## 2017-05-25 MED ORDER — PHENTERMINE HCL 37.5 MG PO CAPS: 37.5000 mg | ORAL_CAPSULE | ORAL | 1 refills | Status: DC

## 2017-05-25 MED ORDER — ALPRAZOLAM 0.5 MG PO TABS: 0.2500 mg | ORAL_TABLET | Freq: Three times a day (TID) | ORAL | 0 refills | Status: DC | PRN

## 2017-05-25 NOTE — Patient Instructions (Signed)
Continue Wellbutrin--contact your pharmacy when refills are needed. Use the alprazolam sparingly as needed for anxiety.  Work on other relaxation/stress-reduction techniques. If anxiety persists, then we should consider restarting either SNRI or SSRI at low dose, along with continuing the Wellbutrin.  I've sent in another 2 months of phentermine, hopefully that will get you closer to goal, and then try maintaining on your own.  Keep up the good work!

## 2017-05-25 NOTE — Progress Notes (Signed)
Chief Complaint  Patient presents with  . Med check    for phentermine and wellbutrin.    Patient presents for f/u on depression, and requesting refill on phentermine.  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 It seems to be working well for depression. She has some anxiety.  Going back to court next month, dealing again with custody issues with ex-husband (who ordered protective order against her current husband, playing "games" again). In the past, court has dragged on for much longer than it should have due to his changing lawyers and other issues. Anxiety mostly affects her at night, trouble sleeping.  Has never taken alprazolam in the past.   She was last prescribed 3 months of phentermine in August (didn't start until mid-October when she finished nursing).  It helps with the evening hunger/over-eating. She has had good success with weight loss.  Hasn't regained since she ran out, but hasn't lost further. She remains motivated, and exercises when she can. She is more active on the weeks she doesn't have 6 kids at home--able to exercise 3-4x/week, about 45 minutes; only gets on the elliptical once a week when all the kids are there. She would like to get to 140#  She hasn't had any migraines recently.  Previously took topamax (for both migraine prevention, as well as for weight loss in combo with the phentermine (ie Qsymia)).  PMH, PSH, SH reviewed Got her flu shot at pharmacy  Outpatient Encounter Medications as of 05/25/2017  Medication Sig Note  . buPROPion (WELLBUTRIN XL) 300 MG 24 hr tablet Take 1 tablet (300 mg total) daily by mouth.   . [DISCONTINUED] FLUoxetine (PROZAC) 10 MG capsule Take 1 capsule (10 mg total) by mouth daily. Increase to 292mafter 1-2 weeks as directed   . acetaminophen (TYLENOL) 500 MG tablet Take 1,000 mg by mouth every 6  (six) hours as needed for mild pain or headache.   . ibuprofen (ADVIL,MOTRIN) 600 MG tablet Take 1 tablet (600 mg total) by mouth every 6 (six) hours. (Patient not taking: Reported on 04/09/2016)   . phentermine 37.5 MG capsule Take 1 capsule (37.5 mg total) by mouth every morning. (Patient not taking: Reported on 05/25/2017)   . [DISCONTINUED] meloxicam (MOBIC) 15 MG tablet Take 1 tablet by mouth once daily with food for up to 2 weeks, then as needed   . [DISCONTINUED] metoCLOPramide (REGLAN) 10 MG tablet TAKE ONE TABLET BY MOUTH THREE TIMES A DAY 01/07/2017: Decreased to 2x/d, only feeding at night now, weaning off  . [DISCONTINUED] Prenatal Vit-Fe Fumarate-FA (PRENATAL MULTIVITAMIN) TABS tablet Take 1 tablet by mouth at bedtime.    . [DISCONTINUED] sertraline (ZOLOFT) 50 MG tablet Take 25 mg by mouth daily.  01/07/2017: Cut back to 2559m month ago due to sedation   No facility-administered encounter medications on file as of 05/25/2017.    Has some frova at home prn for migraines.  Allergies  Allergen Reactions  . Penicillins Other (See Comments)    Has patient had a PCN reaction causing immediate rash, facial/tongue/throat swelling, SOB or lightheadedness with hypotension: No Has patient had a PCN reaction causing severe rash involving mucus membranes or skin necrosis: No Has patient had a PCN reaction that required hospitalization No Has patient had a PCN reaction occurring within the last 10 years: No If all of the above answers are "NO", then  may proceed with Cephalosporin use.   . Sulfa Antibiotics Rash   ROS: +intentional weight loss. No URI symptoms, fever, chills, chest pain, palpitations, headaches, dizziness.  No GI complaints, bowel changes. Some anxiety per HPI. No bleeding, bruising, rash or other complaints   PHYSICAL EXAM:  BP 118/70   Pulse 88   Ht 5' 4.5" (1.638 m)   Wt 155 lb 6.4 oz (70.5 kg)   LMP 05/22/2017 (Exact Date)   BMI 26.26 kg/m   Wt Readings from Last  3 Encounters:  05/25/17 155 lb 6.4 oz (70.5 kg)  01/07/17 174 lb 12.8 oz (79.3 kg)  04/09/16 176 lb 12.8 oz (80.2 kg)   Well appearing, pleasant female, in good spirits, with active child (86 yo) in room  PHQ-9 score of 2 GAD-7 score of 4 See screens in epic  HEENT: conjunctiva and sclera are clear, EOMI, OP clear Neck: no lymphadenopathy, thyromegaly or mass Heart: regular rate and rhythm Lungs: clear bilaterally Back: no CVA tenderness Abdomen: soft, nontender, no organomegaly or mass Extremities: no edema Skin: normal turgor, no rash Psych: normal mood, affect, hygiene, grooming, eye contact and speech   ASSESSMENT/PLAN:  Situational anxiety - counseled re: stress reduction. use alprazolam sparingly, risks/side effects reviewed. Consider adding SSRI or SNRI back if needed - Plan: ALPRAZolam (XANAX) 0.5 MG tablet  Depression, major, recurrent, mild ([place]) - doing well on wellbutrin, continue  Overweight (BMI 25.0-29.9) - refill phentermine x 2 mos (not for longterm use); counseled re: diet/exercise; congratulated on her success so far - Plan: phentermine 37.5 MG capsule   Consider adding back SSRI vs SNRI in low dose to help with anxiety Continue Wellbutrin Alprazolam prn anxiety #15  Pharmacy only giving her #30 of the wellbutrin--still has 1 refill left.  Will contact pharmacy when needed   30 min visit, more than 1/2 spent counseling

## 2017-06-08 ENCOUNTER — Other Ambulatory Visit: Payer: Self-pay | Admitting: Family Medicine

## 2017-06-08 DIAGNOSIS — Z1231 Encounter for screening mammogram for malignant neoplasm of breast: Secondary | ICD-10-CM

## 2017-06-27 ENCOUNTER — Other Ambulatory Visit: Payer: Self-pay | Admitting: Family Medicine

## 2017-07-02 ENCOUNTER — Ambulatory Visit: Payer: BLUE CROSS/BLUE SHIELD

## 2017-07-07 ENCOUNTER — Ambulatory Visit
Admission: RE | Admit: 2017-07-07 | Discharge: 2017-07-07 | Disposition: A | Discharge status: Home / Self Care | Payer: BLUE CROSS/BLUE SHIELD | Source: Ambulatory Visit | Attending: Family Medicine | Admitting: Family Medicine

## 2017-07-07 DIAGNOSIS — Z1231 Encounter for screening mammogram for malignant neoplasm of breast: Secondary | ICD-10-CM | POA: Diagnosis not present

## 2017-07-09 ENCOUNTER — Other Ambulatory Visit: Payer: Self-pay | Admitting: Family Medicine

## 2017-07-09 DIAGNOSIS — R928 Other abnormal and inconclusive findings on diagnostic imaging of breast: Secondary | ICD-10-CM

## 2017-07-14 ENCOUNTER — Ambulatory Visit
Admission: RE | Admit: 2017-07-14 | Discharge: 2017-07-14 | Disposition: A | Discharge status: Home / Self Care | Payer: BLUE CROSS/BLUE SHIELD | Source: Ambulatory Visit | Attending: Family Medicine | Admitting: Family Medicine

## 2017-07-14 DIAGNOSIS — R921 Mammographic calcification found on diagnostic imaging of breast: Secondary | ICD-10-CM | POA: Diagnosis not present

## 2017-07-14 DIAGNOSIS — R928 Other abnormal and inconclusive findings on diagnostic imaging of breast: Secondary | ICD-10-CM

## 2017-07-25 ENCOUNTER — Other Ambulatory Visit: Payer: Self-pay | Admitting: Family Medicine

## 2017-07-27 NOTE — Telephone Encounter (Signed)
Wasn't sure how many refills to put on this.

## 2017-07-28 NOTE — Telephone Encounter (Signed)
Probably needs med check (vs CPE, I know she sees GYN) for 6 mos (July Sharia Reeve).   I sent in 6 mos of refills, but get her scheduled please. Thanks

## 2017-07-29 ENCOUNTER — Telehealth: Payer: Self-pay | Admitting: *Deleted

## 2017-07-29 NOTE — Telephone Encounter (Signed)
Called patient to schedule her for the 62momed check. She wanted to come in next week in the afternoon-she has her kids the following week. Wants to discuss adding something for anxiety. You have a 4:00 on Wed 3/27 but it's a 15 min slot, are you okay with this? I told her I would have to okay this before I could schedule it. Is this okay?

## 2017-07-29 NOTE — Telephone Encounter (Signed)
That's fine

## 2017-08-04 NOTE — Progress Notes (Signed)
Chief Complaint  Patient presents with  . Consult    want to discuss anxiety. Likes wellbutin but wants it to work on anxiety too.     She presents today to discuss worsening anxiety. Stressors include ongoing issues with ex-husband and custody, as well as her current husband Audiological scientist) recently losing his job.  At her visit in January, she had been doing well on Wellbutrin XL 315m, with significant improvement in depression, was having some anxiety issues related to court/custody (situational); was having some trouble sleeping, and was prescribed alprazolam to use prn.  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  She recalls a lot of sedation from lexapro and zoloft, and admits to being afraid of SSRI's due to sedation. She also previously has taken Cymbalta, but recalls that lost efficacy (prior to her pregnancy), and so was switched to Wellbutrin.  She reports trouble sleeping, chest feels tight, impending doom 5 days/week. Has taken alprazolam only at night about 5x when she can't sleep. No problems with anxiety when at work.  Just when home and things are quiet (day or evening). See PHQ-9 and GAD-7 scores from today below.   At last visit: PHQ-9 score of 2 GAD-7 score of 4  PMH, PSH, SH reviewed  Outpatient Encounter Medications as of 08/05/2017  Medication Sig  . ALPRAZolam (XANAX) 0.5 MG tablet Take 0.5-1 tablets (0.25-0.5 mg total) by mouth 3 (three) times daily as needed for anxiety or sleep.  .Marland KitchenbuPROPion (WELLBUTRIN XL) 300 MG 24 hr tablet TAKE ONE TABLET BY MOUTH DAILY  . acetaminophen (TYLENOL) 500 MG tablet Take 1,000 mg by mouth every 6 (six) hours as needed for mild pain or headache.  .Marland KitchenFLUoxetine (PROZAC) 10 MG tablet Start at 1/2 tablet by mouth daily in the morning and increase to full tablet as we discussed  . ibuprofen (ADVIL,MOTRIN) 600 MG tablet Take 1 tablet (600 mg total) by mouth every 6 (six) hours. (Patient not taking:  Reported on 04/09/2016)  . phentermine 37.5 MG capsule Take 1 capsule (37.5 mg total) by mouth every morning. (Patient not taking: Reported on 08/05/2017)   No facility-administered encounter medications on file as of 08/05/2017.    (prozac started today)  Allergies  Allergen Reactions  . Penicillins Other (See Comments)    Has patient had a PCN reaction causing immediate rash, facial/tongue/throat swelling, SOB or lightheadedness with hypotension: No Has patient had a PCN reaction causing severe rash involving mucus membranes or skin necrosis: No Has patient had a PCN reaction that required hospitalization No Has patient had a PCN reaction occurring within the last 10 years: No If all of the above answers are "NO", then may proceed with Cephalosporin use.   . Sulfa Antibiotics Rash   ROS:  No fever, chills, URI symptoms, weight changes (finished phentermine, weight has remained stable).  Anxiety per HPI, denies depression.  No other complaints.   PHYSICAL EXAM:  BP 108/64   Pulse 68   Ht 5' 4.5" (1.638 m)   Wt 152 lb 3.2 oz (69 kg)   LMP 07/10/2017 (Exact Date)   Breastfeeding? No   BMI 25.72 kg/m   Wt Readings from Last 3 Encounters:  08/05/17 152 lb 3.2 oz (69 kg)  05/25/17 155 lb 6.4 oz (70.5 kg)  01/07/17 174 lb 12.8 oz (79.3 kg)    PHQ-9 score of 3 GAD-7 score of 13 (up from 4 at last visit).  Well appearing, well-dressed, pleasant  female in no distress Normal mood, affect, hygiene and grooming. She remains very calm in discussing ongoing stressors and anxiety symptoms. Normal eye contact, speech.  ASSESSMENT/PLAN:  Generalized anxiety disorder - significantly worse than last visit. Discussed SSRI's vs Buspar. Willing to try fluoxetine, titrate slowly (due to increased anxiety) - Plan: FLUoxetine (PROZAC) 10 MG tablet  Depression, major, recurrent, mild ([facility]) - mainly in remission, doing well on Wellbutrin, continue  Counseled extensively re: relaxation  techniques; to consider counseling (can't afford right now, can get 3 sessions through [facility], but hesitant to do so). Discussed stressors, husband not actively looking for job. Discussed how to be supportive, yet also encouraging.  She reports she wouldn't have a problem if he stayed at home, pulled baby from daycare, fewer camps for kids, for financial reasons, if he was willing to do so, vs him getting a job. She'd be willing to work extra hours at a second job if he was staying at home full-time.  Discussed risks/side effects of fluoxetine--the least likely to be sedating in SSRI's.  May cause increase in anxiety initially, so start low/slow.  If she does not tolerate this, next option would be to add Buspar in its place. She will contact us if this is needed. She will contact me within 6 weeks to let me know how she is doing.  35-40 min visit, more than 1/2 spent counseling.  Continue the 338m of wellbutrin daily. Start fluoxetine 114mat only 1/2 tablet once daily in the morning. Increase to the full tablet after 1-2 weeks.  It likely will take a few weeks to see any positive effect, and you likely will have some increased anxiety initially. Use the alprazolam if needed. Continue to try and work on relaxation techniques in the evening. If you aren't tolerating this or it isn't effective, contact me to change it to the Buspar as we discussed.

## 2017-08-05 ENCOUNTER — Encounter: Payer: Self-pay | Admitting: Family Medicine

## 2017-08-05 ENCOUNTER — Ambulatory Visit (INDEPENDENT_AMBULATORY_CARE_PROVIDER_SITE_OTHER): Payer: BLUE CROSS/BLUE SHIELD | Admitting: Family Medicine

## 2017-08-05 VITALS — BP 108/64 | HR 68 | Ht 64.5 in | Wt 152.2 lb

## 2017-08-05 DIAGNOSIS — F330 Major depressive disorder, recurrent, mild: Secondary | ICD-10-CM | POA: Diagnosis not present

## 2017-08-05 DIAGNOSIS — F411 Generalized anxiety disorder: Secondary | ICD-10-CM | POA: Diagnosis not present

## 2017-08-05 MED ORDER — FLUOXETINE HCL 10 MG PO TABS: ORAL_TABLET | ORAL | 2 refills | Status: DC

## 2017-08-05 NOTE — Patient Instructions (Signed)
Continue the 322m of wellbutrin daily. Start fluoxetine 126mat only 1/2 tablet once daily in the morning. Increase to the full tablet after 1-2 weeks.  It likely will take a few weeks to see any positive effect, and you likely will have some increased anxiety initially. Use the alprazolam if needed. Continue to try and work on relaxation techniques in the evening. If you aren't tolerating this or it isn't effective, contact me to change it to the Buspar as we discussed.

## 2017-11-15 ENCOUNTER — Other Ambulatory Visit: Payer: Self-pay | Admitting: Family Medicine

## 2017-11-15 DIAGNOSIS — F411 Generalized anxiety disorder: Secondary | ICD-10-CM

## 2017-11-16 NOTE — Telephone Encounter (Signed)
Is this okay to refill? 

## 2017-11-17 MED ORDER — FLUOXETINE HCL 10 MG PO CAPS: 10.0000 mg | ORAL_CAPSULE | Freq: Every day | ORAL | 0 refills | Status: DC

## 2017-11-28 ENCOUNTER — Other Ambulatory Visit: Payer: Self-pay | Admitting: Family Medicine

## 2017-12-06 DIAGNOSIS — R21 Rash and other nonspecific skin eruption: Secondary | ICD-10-CM | POA: Diagnosis not present

## 2018-01-27 NOTE — Progress Notes (Signed)
Chief Complaint  Patient presents with  . Med check    med check for anxiety meds. Restarted topamax-is at 175m currently.     She presents today to follow up on depression and anxiety.  At her last visit, fluoxetine 145mwas added to Wellbutrin XL, to further help with symptoms of anxiety. Stressors include ongoing issues with ex-husband and custody, as well as her current husband (JAudiological scientistbeing out of a job since earlier this year. She is frustrated that he hasn't been looking for a job. She reports he is depressed, but won't seek help. She uses alprazolam prn (usually just atn ight if she can't sleep).  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  She previously has taken Cymbalta (lost efficacy, prior to pregnancy), and recalls sedation from lexapro and zoloft in the past.  In March, prior to starting the fluoxetine she reported trouble sleeping, chest feels tight, impending doom 5 days/week. No problems with anxiety when at work.  Just when home and things are quiet (day or evening). See PHQ-9 and GAD-7 scores from today below.   At last visit: PHQ-9 score of 3 (was 2 in January) GAD-7 score of 13 (up from 4 in January).  She had interviewed elsewhere for full time job, as she is looking to boot her income since her husband isn't working.  She may be able to increase her hours at her current job to 8a-2:45p daily, 30 hrs/week. She likes her job.  Started having recurrent migraines about 3 months ago, 2-3/week. She had leftover topamax, titrated up from 2570mo 50, and further up to 100m23mShe has been on the 100mg37me for about a month.  Getting migraines before and after her cycles now, but not as often, no other times.  She denies any mood changes, and is tolerating without side effects.  She is asking for refill.  Given Frova in 2016,  helped some (needed sleep to fully resolve, but didn't cause nausea, was able to take during the day). She doesn't currently have acute meds.  She does get an aura, and will take ibuprofen with caffeine, but it just dulls it, doesn't prevent or resolve the headache.  Only sleep seems to.  PMH, PSH, SH reviewed  Outpatient Encounter Medications as of 01/28/2018  Medication Sig  . ALPRAZolam (XANAX) 0.5 MG tablet Take 0.5-1 tablets (0.25-0.5 mg total) by mouth 3 (three) times daily as needed for anxiety or sleep.  . buPMarland KitchenOPion (WELLBUTRIN XL) 300 MG 24 hr tablet Take 1 tablet (300 mg total) by mouth daily.  . FLUMarland Kitchenxetine (PROZAC) 10 MG capsule Take 1 capsule (10 mg total) by mouth daily.  . topMarland Kitchenramate (TOPAMAX) 100 MG tablet Take 1 tablet (100 mg total) by mouth daily.  . [DISCONTINUED] ALPRAZolam (XANAX) 0.5 MG tablet Take 0.5-1 tablets (0.25-0.5 mg total) by mouth 3 (three) times daily as needed for anxiety or sleep.  . [DISCONTINUED] buPROPion (WELLBUTRIN XL) 300 MG 24 hr tablet TAKE 1 TABLET BY MOUTH DAILY  . [DISCONTINUED] FLUoxetine (PROZAC) 10 MG capsule Take 1 capsule (10 mg total) by mouth daily.  . [DISCONTINUED] topiramate (TOPAMAX) 100 MG tablet Take 100 mg by mouth daily.   . aceMarland Kitchenaminophen (TYLENOL) 500 MG tablet Take 1,000 mg by mouth every 6 (six) hours as needed for mild pain or headache.  . frovatriptan (FROVA) 2.5 MG tablet Take  1 tablet (2.5 mg total) by mouth as needed for migraine. If recurs, may repeat after 2 hours. Max of 3 tabs in 24 hours.  Marland Kitchen ibuprofen (ADVIL,MOTRIN) 600 MG tablet Take 1 tablet (600 mg total) by mouth every 6 (six) hours. (Patient not taking: Reported on 04/09/2016)  . phentermine 37.5 MG capsule Take 1 capsule (37.5 mg total) by mouth every morning. (Patient not taking: Reported on 08/05/2017)  . [DISCONTINUED] FLUoxetine (PROZAC) 10 MG tablet Start at 1/2 tablet by mouth daily in the morning and increase to full tablet as we discussed   No  facility-administered encounter medications on file as of 01/28/2018.    Allergies  Allergen Reactions  . Penicillins Other (See Comments)    Has patient had a PCN reaction causing immediate rash, facial/tongue/throat swelling, SOB or lightheadedness with hypotension: No Has patient had a PCN reaction causing severe rash involving mucus membranes or skin necrosis: No Has patient had a PCN reaction that required hospitalization No Has patient had a PCN reaction occurring within the last 10 years: No If all of the above answers are "NO", then may proceed with Cephalosporin use.   . Sulfa Antibiotics Rash   ROS:  Migraines per HPI.  No fever, chills, URI symptoms, dizziness, chest pain, palpitations, nausea, vomiting, bowel changes, bleeding, bruising, rash. Anxiety and depression improved; frustrated with her husband, and his lack of motivation to try and get a job or get his depression treated. Other than headaches, no neurologic complaints.   PHYSICAL EXAM:  BP 110/60   Pulse 72   Ht 5' 4.5" (1.638 m)   Wt 154 lb 9.6 oz (70.1 kg)   LMP 01/14/2018 (Approximate)   Breastfeeding? No   BMI 26.13 kg/m   Wt Readings from Last 3 Encounters:  01/28/18 154 lb 9.6 oz (70.1 kg)  08/05/17 152 lb 3.2 oz (69 kg)  05/25/17 155 lb 6.4 oz (70.5 kg)    Well-appearing, pleasant female in no distress, in good spirits. HEENT: conjunctiva and sclera are clear, EOMI, OP clear Neck: no lymphadenopathy, thyromegaly or mass Heart: regular rate and rhythm Lungs: clear bilaterally Back: no CVA tenderness Abdomen: soft, nontender, no organomegaly or mass Extremities: no edema Skin: normal turgor, no rash Psych: normal mood, affect, hygiene, grooming. She remains very calm in discussing ongoing stressors and frustrations. Normal eye contact, speech. Neuro: alert and oriented, cranial nerves intact, normal strength, gait.  PHQ-9  Score of 1 GAD-7  Score of 1 (down from  13)   ASSESSMENT/PLAN:  Generalized anxiety disorder - significantly improved; continue prozac and wellbutrin - Plan: FLUoxetine (PROZAC) 10 MG capsule  Need for influenza vaccination - Plan: Flu Vaccine QUAD 6+ mos PF IM (Fluarix Quad PF)  Migraine with aura and without status migrainosus, not intractable - improved since restarting topamax. r/f Frova for prn use. - Plan: topiramate (TOPAMAX) 100 MG tablet, frovatriptan (FROVA) 2.5 MG tablet  Depression, major, recurrent, in remission ([place]) - remains improved; continue prozac and wellbutrin - Plan: buPROPion (WELLBUTRIN XL) 300 MG 24 hr tablet  Situational anxiety - to use alprazolam sparingly, prn (uses rarely when can't sleep) - Plan: ALPRAZolam (XANAX) 0.5 MG tablet   pharmquest study--not sure if she is candidate, but might be interested.

## 2018-01-28 ENCOUNTER — Encounter: Payer: Self-pay | Admitting: Family Medicine

## 2018-01-28 ENCOUNTER — Ambulatory Visit (INDEPENDENT_AMBULATORY_CARE_PROVIDER_SITE_OTHER): Payer: BLUE CROSS/BLUE SHIELD | Admitting: Family Medicine

## 2018-01-28 VITALS — BP 110/60 | HR 72 | Ht 64.5 in | Wt 154.6 lb

## 2018-01-28 DIAGNOSIS — F418 Other specified anxiety disorders: Secondary | ICD-10-CM

## 2018-01-28 DIAGNOSIS — Z23 Encounter for immunization: Secondary | ICD-10-CM | POA: Diagnosis not present

## 2018-01-28 DIAGNOSIS — F411 Generalized anxiety disorder: Secondary | ICD-10-CM

## 2018-01-28 DIAGNOSIS — F3340 Major depressive disorder, recurrent, in remission, unspecified: Secondary | ICD-10-CM

## 2018-01-28 DIAGNOSIS — G43109 Migraine with aura, not intractable, without status migrainosus: Secondary | ICD-10-CM | POA: Diagnosis not present

## 2018-01-28 MED ORDER — TOPIRAMATE 100 MG PO TABS: 100.0000 mg | ORAL_TABLET | Freq: Every day | ORAL | 1 refills | Status: DC

## 2018-01-28 MED ORDER — FLUOXETINE HCL 10 MG PO CAPS: 10.0000 mg | ORAL_CAPSULE | Freq: Every day | ORAL | 1 refills | Status: DC

## 2018-01-28 MED ORDER — BUPROPION HCL ER (XL) 300 MG PO TB24: 300.0000 mg | ORAL_TABLET | Freq: Every day | ORAL | 1 refills | Status: DC

## 2018-01-28 MED ORDER — ALPRAZOLAM 0.5 MG PO TABS: 0.2500 mg | ORAL_TABLET | Freq: Three times a day (TID) | ORAL | 0 refills | Status: DC | PRN

## 2018-01-28 MED ORDER — FROVATRIPTAN SUCCINATE 2.5 MG PO TABS: 2.5000 mg | ORAL_TABLET | ORAL | 0 refills | Status: DC | PRN

## 2018-01-28 NOTE — Patient Instructions (Signed)
You could potentially start the frova 2 days prior to your cycle, and take it for 6 days (once or twice daily, for prevention of menstrual migraines), as opposed to using it just prn.  Continue your current medications. Contact me if any problems.

## 2018-01-29 ENCOUNTER — Encounter: Payer: Self-pay | Admitting: Family Medicine

## 2018-01-29 ENCOUNTER — Other Ambulatory Visit: Payer: Self-pay | Admitting: Family Medicine

## 2018-01-29 DIAGNOSIS — R921 Mammographic calcification found on diagnostic imaging of breast: Secondary | ICD-10-CM

## 2018-02-02 ENCOUNTER — Other Ambulatory Visit: Payer: Self-pay

## 2018-02-02 ENCOUNTER — Other Ambulatory Visit: Payer: Self-pay | Admitting: Family Medicine

## 2018-02-02 DIAGNOSIS — R921 Mammographic calcification found on diagnostic imaging of breast: Secondary | ICD-10-CM

## 2018-02-08 ENCOUNTER — Telehealth: Payer: Self-pay | Admitting: Family Medicine

## 2018-02-08 DIAGNOSIS — G43109 Migraine with aura, not intractable, without status migrainosus: Secondary | ICD-10-CM

## 2018-02-08 NOTE — Telephone Encounter (Signed)
P.A. FROVATRIPTAN

## 2018-02-10 ENCOUNTER — Encounter: Payer: Self-pay | Admitting: Family Medicine

## 2018-02-10 MED ORDER — ISOMETHEPTENE-DICHLORAL-APAP 65-100-325 MG PO CAPS: 1.0000 | ORAL_CAPSULE | Freq: Four times a day (QID) | ORAL | 0 refills | Status: DC | PRN

## 2018-02-10 NOTE — Addendum Note (Signed)
Addended by: Rita Ohara on: 02/10/2018 06:32 PM   Modules accepted: Orders

## 2018-02-10 NOTE — Telephone Encounter (Signed)
She is involved in study, cannot take triptans.  Previously took [place]s.

## 2018-02-10 NOTE — Telephone Encounter (Signed)
P.A. Isabelle Course, pt must try and fail preferred alternatives, sumatriptan, naratriptan, rizatriptan. Do you want to switch?

## 2018-02-10 NOTE — Telephone Encounter (Signed)
Message sent to pt on MyChart to see if she has a preference.  Await reply.

## 2018-02-12 ENCOUNTER — Other Ambulatory Visit: Payer: Self-pay | Admitting: Family Medicine

## 2018-02-12 ENCOUNTER — Telehealth: Payer: Self-pay | Admitting: Family Medicine

## 2018-02-12 DIAGNOSIS — F418 Other specified anxiety disorders: Secondary | ICD-10-CM

## 2018-02-12 MED ORDER — BUTALBITAL-APAP-CAFFEINE 50-325-40 MG PO TABS: 1.0000 | ORAL_TABLET | Freq: Four times a day (QID) | ORAL | 0 refills | Status: DC | PRN

## 2018-02-12 NOTE — Telephone Encounter (Signed)
  Fax from [facility] re:  isometheptene-acetaminophen-dichloralphenazone [phone]  Note on fax states: This is no longer available from our wholesaler. Can you send in something else?

## 2018-02-12 NOTE — Telephone Encounter (Signed)
Spoke with pt, switched to fioricet.

## 2018-02-15 NOTE — Telephone Encounter (Signed)
This was just refilled for her on 9/19--does she really need this again?? Please check

## 2018-02-15 NOTE — Telephone Encounter (Signed)
Is this okay to refill? 

## 2018-02-15 NOTE — Telephone Encounter (Signed)
No, not needed.

## 2018-02-16 ENCOUNTER — Ambulatory Visit
Admission: RE | Admit: 2018-02-16 | Discharge: 2018-02-16 | Disposition: A | Discharge status: Home / Self Care | Payer: BLUE CROSS/BLUE SHIELD | Source: Ambulatory Visit | Attending: Family Medicine | Admitting: Family Medicine

## 2018-02-16 ENCOUNTER — Other Ambulatory Visit: Payer: Self-pay | Admitting: Family Medicine

## 2018-02-16 DIAGNOSIS — R921 Mammographic calcification found on diagnostic imaging of breast: Secondary | ICD-10-CM

## 2018-03-23 DIAGNOSIS — F331 Major depressive disorder, recurrent, moderate: Secondary | ICD-10-CM | POA: Diagnosis not present

## 2018-03-29 DIAGNOSIS — Z008 Encounter for other general examination: Secondary | ICD-10-CM | POA: Diagnosis not present

## 2018-03-30 DIAGNOSIS — F331 Major depressive disorder, recurrent, moderate: Secondary | ICD-10-CM | POA: Diagnosis not present

## 2018-04-06 ENCOUNTER — Encounter: Payer: Self-pay | Admitting: Family Medicine

## 2018-04-06 ENCOUNTER — Other Ambulatory Visit: Payer: Self-pay | Admitting: Family Medicine

## 2018-04-06 DIAGNOSIS — F418 Other specified anxiety disorders: Secondary | ICD-10-CM

## 2018-04-06 MED ORDER — ALPRAZOLAM 0.5 MG PO TABS: 0.2500 mg | ORAL_TABLET | Freq: Three times a day (TID) | ORAL | 0 refills | Status: DC | PRN

## 2018-04-06 NOTE — Telephone Encounter (Signed)
Pt sent a request for a refill?

## 2018-04-06 NOTE — Telephone Encounter (Signed)
D/w pt--husband admitted to [facility] for alcohol rehab--having increased anxiety.  Refill sent

## 2018-04-13 DIAGNOSIS — D3121 Benign neoplasm of right retina: Secondary | ICD-10-CM | POA: Diagnosis not present

## 2018-04-13 DIAGNOSIS — D3122 Benign neoplasm of left retina: Secondary | ICD-10-CM | POA: Diagnosis not present

## 2018-05-12 ENCOUNTER — Other Ambulatory Visit: Payer: Self-pay | Admitting: Family Medicine

## 2018-05-12 DIAGNOSIS — F411 Generalized anxiety disorder: Secondary | ICD-10-CM

## 2018-06-18 ENCOUNTER — Encounter: Payer: Self-pay | Admitting: Family Medicine

## 2018-06-18 MED ORDER — OSELTAMIVIR PHOSPHATE 75 MG PO CAPS: 75.0000 mg | ORAL_CAPSULE | Freq: Two times a day (BID) | ORAL | 0 refills | Status: DC

## 2018-08-04 ENCOUNTER — Encounter: Payer: BLUE CROSS/BLUE SHIELD | Admitting: Family Medicine

## 2018-11-30 DIAGNOSIS — F4320 Adjustment disorder, unspecified: Secondary | ICD-10-CM | POA: Diagnosis not present

## 2018-12-13 NOTE — Progress Notes (Signed)
Start time: 11:30 End time: 11:49  Virtual Visit via Telephone Note   I connected with Megan Butler on 12/15/2018 by telephone and verified that I am speaking with the correct person using two identifiers. She does not have good internet access at her job, where visit was taking place, so had to do over the telephone instead.  Location: Patient: office, alone Provider: office   I discussed the limitations of evaluation and management by telemedicine and the availability of in person appointments. The patient expressed understanding and agreed to proceed. She consents to her insurance being filed for this visit.  History of Present Illness:  Chief Complaint  Patient presents with  . Depression    PHONE CALL med check. No concerns.     She presents today to follow up on depression and anxiety. She was last seen in 01/2018, at which time she was doing very well on her regimen of fluoxetine 7m and Wellbutrin XL. Prior to the addition of fluoxetine she was having a lot more anxiety (GAD-7 score of 13).  Stressors included ongoing issues with ex-husband and custody, as well as her current husband (Audiological scientist being out of a job, alcoholic. She has alprazolam to use prn (mainly if she can't fall asleep, infrequently). She ran out.  Last filled #15 in 03/2018.  Doesn't feel like she needs it now. Sleep has been good overall. At her last visit (01/2018), PHQ-9  Score of 1, GAD-7  Score of 1 (down from 13).  She continues to well on this regimen.   Husband has remained sober, home with the kids, which has been helpful during the pandemic. She is working more hours, so there is less financial stress.  Still has to deal with her ex-husband about the kids, but overall things are good.  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 She previously has taken Cymbalta (lost  efficacy, prior to pregnancy), and recalls sedation from lexapro and zoloft in the past.)  Migraines:  Taking topamax for prevention, and is doing well.  She is currently taking 1083mdaily (restarted about a year ago, after having recurrent migraines). Migraines are right before her cycle, once a month; sometimes will have a second (if not eating/drinking right, stressed, etc), infrequent.  Uses ibuprofen and sleep when she gets one. Fioricet wasn't helpful (used to use Midrin with good results, no longer available). Frova wasn't covered by her insurance.  Weight loss--she is pleased with her weight.  She reports this was helped by stress with work, COVID, wasn't as hungry. Elliptical--2-3 days/week, walks at work.  Sees GYN (Dr[facility]elda [facility]  Outpatient Encounter Medications as of 12/15/2018  Medication Sig  . ALPRAZolam (XANAX) 0.5 MG tablet Take 0.5-1 tablets (0.25-0.5 mg total) by mouth 3 (three) times daily as needed for anxiety or sleep.  . Marland KitchenuPROPion (WELLBUTRIN XL) 300 MG 24 hr tablet Take 1 tablet (300 mg total) by mouth daily.  . Marland KitchenLUoxetine (PROZAC) 10 MG capsule Take 1 capsule (10 mg total) by mouth daily.  . Marland Kitchenopiramate (TOPAMAX) 100 MG tablet Take 1 tablet (100 mg total) by mouth daily.  . [DISCONTINUED] buPROPion (WELLBUTRIN XL) 300 MG 24 hr tablet Take 1 tablet (300 mg total) by mouth daily.  . [DISCONTINUED] FLUoxetine (PROZAC) 10 MG capsule Take 1 capsule (10 mg total) by mouth daily.  . [DISCONTINUED] topiramate (TOPAMAX) 100 MG  tablet Take 1 tablet (100 mg total) by mouth daily.  Marland Kitchen ibuprofen (ADVIL,MOTRIN) 600 MG tablet Take 1 tablet (600 mg total) by mouth every 6 (six) hours. (Patient not taking: Reported on 04/09/2016)  . [DISCONTINUED] acetaminophen (TYLENOL) 500 MG tablet Take 1,000 mg by mouth every 6 (six) hours as needed for mild pain or headache.  . [DISCONTINUED] butalbital-acetaminophen-caffeine (FIORICET, ESGIC) 50-325-40 MG tablet Take 1-2 tablets  by mouth every 6 (six) hours as needed for headache.  . [DISCONTINUED] frovatriptan (FROVA) 2.5 MG tablet Take 1 tablet (2.5 mg total) by mouth as needed for migraine. If recurs, may repeat after 2 hours. Max of 3 tabs in 24 hours.  . [DISCONTINUED] oseltamivir (TAMIFLU) 75 MG capsule Take 1 capsule (75 mg total) by mouth 2 (two) times daily.  . [DISCONTINUED] phentermine 37.5 MG capsule Take 1 capsule (37.5 mg total) by mouth every morning. (Patient not taking: Reported on 08/05/2017)   No facility-administered encounter medications on file as of 12/15/2018.    Allergies  Allergen Reactions  . Penicillins Other (See Comments)    Has patient had a PCN reaction causing immediate rash, facial/tongue/throat swelling, SOB or lightheadedness with hypotension: No Has patient had a PCN reaction causing severe rash involving mucus membranes or skin necrosis: No Has patient had a PCN reaction that required hospitalization No Has patient had a PCN reaction occurring within the last 10 years: No If all of the above answers are "NO", then may proceed with Cephalosporin use.   . Sulfa Antibiotics Rash    ROS: no fever, chills, URI symptoms, chest pain, shortness of breath. Moods are good. +intentional weight loss.  No GI or GU complaints.  Migraines per HPI.    Observations/Objective:   BP 110/60   Ht 5' 4.5" (1.638 m)   Wt 137 lb 12.8 oz (62.5 kg)   LMP 12/13/2018 (Exact Date)   BMI 23.29 kg/m   Wt Readings from Last 3 Encounters:  12/15/18 137 lb 12.8 oz (62.5 kg)  01/28/18 154 lb 9.6 oz (70.1 kg)  08/05/17 152 lb 3.2 oz (69 kg)   Alert, oriented, pleasant female, in good spirits. Normal mood, affect, speech. Exam is limited due to telephone nature of the visit.   Assessment and Plan:  Depression, major, recurrent, in remission ([place]) - continues to do well on prozac and wellbutrin - Plan: buPROPion (WELLBUTRIN XL) 300 MG 24 hr tablet  Generalized anxiety disorder - Controlled,  continue prozac and wellbutrin - Plan: FLUoxetine (PROZAC) 10 MG capsule  Migraine with aura and without status migrainosus, not intractable - Continue Topamax.  Cont ibuprofen at early onset of HA.  contact us for different triptan if not effective (check with insurance) - Plan: topiramate (TOPAMAX) 100 MG tablet   Follow Up Instructions:    I discussed the assessment and treatment plan with the patient. The patient was provided an opportunity to ask questions and all were answered. The patient agreed with the plan and demonstrated an understanding of the instructions.   The patient was advised to call back or seek an in-person evaluation if the symptoms worsen or if the condition fails to improve as anticipated.  I provided 19 minutes of non-face-to-face time during this encounter.   Vikki Ports, MD

## 2018-12-15 ENCOUNTER — Other Ambulatory Visit: Payer: Self-pay

## 2018-12-15 ENCOUNTER — Ambulatory Visit (INDEPENDENT_AMBULATORY_CARE_PROVIDER_SITE_OTHER): Payer: BC Managed Care – PPO | Admitting: Family Medicine

## 2018-12-15 ENCOUNTER — Encounter: Payer: Self-pay | Admitting: Family Medicine

## 2018-12-15 VITALS — BP 110/60 | Ht 64.5 in | Wt 137.8 lb

## 2018-12-15 DIAGNOSIS — F3340 Major depressive disorder, recurrent, in remission, unspecified: Secondary | ICD-10-CM

## 2018-12-15 DIAGNOSIS — F411 Generalized anxiety disorder: Secondary | ICD-10-CM

## 2018-12-15 DIAGNOSIS — G43109 Migraine with aura, not intractable, without status migrainosus: Secondary | ICD-10-CM

## 2018-12-15 MED ORDER — TOPIRAMATE 100 MG PO TABS: 100.0000 mg | ORAL_TABLET | Freq: Every day | ORAL | 3 refills | Status: DC

## 2018-12-15 MED ORDER — BUPROPION HCL ER (XL) 300 MG PO TB24: 300.0000 mg | ORAL_TABLET | Freq: Every day | ORAL | 3 refills | Status: DC

## 2018-12-15 MED ORDER — FLUOXETINE HCL 10 MG PO CAPS: 10.0000 mg | ORAL_CAPSULE | Freq: Every day | ORAL | 3 refills | Status: DC

## 2019-01-18 ENCOUNTER — Other Ambulatory Visit: Payer: Self-pay

## 2019-01-18 ENCOUNTER — Other Ambulatory Visit: Payer: Self-pay | Admitting: *Deleted

## 2019-01-18 ENCOUNTER — Encounter: Payer: Self-pay | Admitting: Family Medicine

## 2019-01-18 ENCOUNTER — Ambulatory Visit (INDEPENDENT_AMBULATORY_CARE_PROVIDER_SITE_OTHER): Payer: BC Managed Care – PPO | Admitting: Family Medicine

## 2019-01-18 VITALS — Temp 98.1°F | Wt 132.0 lb

## 2019-01-18 DIAGNOSIS — J3489 Other specified disorders of nose and nasal sinuses: Secondary | ICD-10-CM | POA: Diagnosis not present

## 2019-01-18 DIAGNOSIS — Z20822 Contact with and (suspected) exposure to covid-19: Secondary | ICD-10-CM

## 2019-01-18 DIAGNOSIS — J029 Acute pharyngitis, unspecified: Secondary | ICD-10-CM | POA: Diagnosis not present

## 2019-01-18 NOTE — Progress Notes (Signed)
   Subjective:   Documentation for virtual audio and video telecommunications through [place] encounter:  The patient was located at home. 2 patient identifiers used.  The provider was located in the office. The patient did consent to this visit and is aware of possible charges through their insurance for this visit.  The other persons participating in this telemedicine service were none.    Patient ID: Megan Butler, female    DOB: [birth year], 43 y.o.   MRN: [id number]  HPI Chief Complaint  Patient presents with  . sick    sore throat, runny nose, congestion[place] shot- thursday but started symptoms monday 9/7    Complains of a 1 day history of sore throat, rhinorrhea, nasal congestion, mild cough.  Denies fever, chills, body aches, chest pain, shortness of breath.  No loss of taste or smell.  No known exposures to anyone with COVID-19.  States she started the Covid-19 trial with [facility] and had an injection 5 days ago.  States she contacted [facility] to let them know.  States she took ibuprofen and Sudafed with minimal relief.   She is considering getting a test for COVID-19 but would like my opinion regarding whether she should do this.  States her employer would like to be tested.  She works as a Designer, jewellery.  Reviewed allergies, medications, past medical, surgical, family, and social history.    Review of Systems Pertinent positives and negatives in the history of present illness.     Objective:   Physical Exam Temp 98.1 F (36.7 C)   Wt 132 lb (59.9 kg)   BMI 22.31 kg/m   Alert and oriented and in no acute distress.  Respirations unlabored.  No cough present during the visit.  Unable to further examine due to this being a virtual visit.      Assessment & Plan:  Acute pharyngitis, unspecified etiology  Rhinorrhea  Discussed limitations of a virtual visit. Recommend that she continue with symptomatic treatment and that she self  quarantine to be cautious. Discussed that she can go to [facility] for [place] testing if she desires.  I also discussed that she could give her symptoms another 24 to 48 hours to see if she starts to improve.  Due to her employer wanting her to be tested before she can return to work, she has decided that she will go get tested after all.  I am fine with that plan.   Time spent on call was 15 minutes and in review of previous records 1 minutes total.  This virtual service is not related to other E/M service within previous 7 days.

## 2019-01-20 LAB — NOVEL CORONAVIRUS, NAA: SARS-CoV-2, NAA: NOT DETECTED

## 2019-04-20 ENCOUNTER — Encounter: Payer: Self-pay | Admitting: Family Medicine

## 2019-04-25 ENCOUNTER — Other Ambulatory Visit: Payer: Self-pay

## 2019-04-25 ENCOUNTER — Other Ambulatory Visit: Payer: BC Managed Care – PPO

## 2019-04-25 DIAGNOSIS — Z139 Encounter for screening, unspecified: Secondary | ICD-10-CM

## 2019-04-25 DIAGNOSIS — R7612 Nonspecific reaction to cell mediated immunity measurement of gamma interferon antigen response without active tuberculosis: Secondary | ICD-10-CM | POA: Diagnosis not present

## 2019-04-27 LAB — QUANTIFERON-TB GOLD PLUS
QuantiFERON Mitogen Value: >10 IU/mL
QuantiFERON Nil Value: 0.03 IU/mL
QuantiFERON TB1 Ag Value: 0.02 IU/mL
QuantiFERON TB2 Ag Value: 0.02 IU/mL
QuantiFERON-TB Gold Plus: NEGATIVE

## 2019-04-27 LAB — HEPATITIS B SURFACE ANTIBODY, QUANTITATIVE: Hepatitis B Surf Ab Quant: >1000 m[IU]/mL (ref >9.9)

## 2019-05-19 ENCOUNTER — Encounter: Payer: BLUE CROSS/BLUE SHIELD | Admitting: Family Medicine

## 2019-07-26 ENCOUNTER — Encounter: Payer: Self-pay | Admitting: Family Medicine

## 2019-07-26 NOTE — Progress Notes (Signed)
Chief Complaint  Patient presents with  . Annual Exam    nonfasting annual exam, no pap. No concerns. Did not have mammo last year but is going to schedule.     Megan Butler is a 44 y.o. female who presents for a complete physical. She sees Dr. Nelda Marseille for GYN.  She was due in December, not yet scheduled.  She was not quite expecting to also follow-up on depression and anxiety, but this needed to be addressed today as well. She was last seen 12/2018, and at that time was doing well on her regimen of fluoxetine 42m and Wellbutrin XL. At that time, her husband had lost job, was maintaining his sobriety, and was home with the kids (which was helpful during the pandemic). Had some stressors related to ex-husband, but overall had been doing well. Significant change in stressors since that time.  She and her husband are now separated. [place], but very different expectations, longer hours, less pay, new computer system. Not extremely forthcoming in discussing details. She reports that she self-increased the dose of Prozac to 267mabout a year ago.  It causes some tremors, which she doesn't like, but thought she needed the higher dose.  "I'm managing".  Reports being tired all the time.   She has 3 older children every other week, along with [employer] [place] other week, at the same time.  The week that she has no kids--just sleeps and works.  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 She previously has taken Cymbalta (lost efficacy, prior to pregnancy), and recalls sedation from lexapro and zoloft in the past.)   Migraines:  Taking topamax for prevention. She is currently taking 10067maily. She reports having more frequent migraines, about 2-3x/week, with no aura.  She will either wake up with them, or get  them in the evening (and then will just go to bed).  She reports that Frova took the edge off, doesn't always make the headaches go away. Previously did well with Midrin for prn use, no longer available.  Fioricet wasn't helpful.  Weight loss--significant loss was noted at her last visit in 12/2018, which she reported was helped by stress with work, COVID, wasn't as hungry.  She is happy with her weight, has been able to maintain it.  Immunization History  Administered Date(s) Administered  . Influenza Split 02/10/2012, 03/02/2013  . Influenza,inj,Quad PF,6+ Mos 02/13/2015, 01/28/2018, 03/27/2019  . Influenza-Unspecified 02/11/2016, 03/10/2017  . PFIZER SARS-COV-2 Vaccination 01/11/2019, 02/01/2019  . Tdap 01/11/2016   Last Pap smear: 04/2016, normal, no high risk HPV Last mammogram: 02/2018 was 6 month f/u on R; last bilateral mammo was 06/2017 Last colonoscopy: never Last DEXA: never Dentist: 1 year ago (overdue due to COVSalchaphtho: yearly, past due.  Uses reading glasses. Exercise: has a treadmill, 40 minutes 2x/week, no weight-bearing exercise. Elliptical 2-3 days/week, walks at work   PM[facility], [facility] were reviewed and updated  Outpatient Encounter Medications as of 07/28/2019  Medication Sig Note  . topiramate (TOPAMAX) 100 MG tablet Take 1 tablet (100 mg total) by mouth daily.   . [DISCONTINUED] buPROPion (WELLBUTRIN XL) 300 MG 24 hr tablet Take 1 tablet (300 mg total) by mouth daily.   . [DISCONTINUED] FLUoxetine (PROZAC) 10 MG capsule Take 1 capsule (10 mg total) by mouth daily. 07/28/2019:  Taking 40m daily  . ALPRAZolam (XANAX) 0.5 MG tablet Take 0.5-1 tablets (0.25-0.5 mg total) by mouth 3 (three) times daily as needed for anxiety or sleep. (Patient not taking: Reported on 07/28/2019) 07/28/2019: Ran out  . buPROPion (WELLBUTRIN XL) 150 MG 24 hr tablet Take 1 tablet (150 mg total) by mouth daily.   .Marland Kitchenibuprofen (ADVIL,MOTRIN) 600 MG tablet Take 1 tablet (600 mg total) by  mouth every 6 (six) hours. (Patient not taking: Reported on 07/28/2019)   . vortioxetine HBr (TRINTELLIX) 10 MG TABS tablet Take 1 tablet (10 mg total) by mouth daily.   .Marland Kitchenvortioxetine HBr (TRINTELLIX) 5 MG TABS tablet Take 1 tablet (5 mg total) by mouth daily.    No facility-administered encounter medications on file as of 07/28/2019.   (taking wellbutrin XL 3038mand 2016mf fluoxetine prior to today's appointment, and NOT Trintellix)  Allergies  Allergen Reactions  . Penicillins Other (See Comments)    Has patient had a PCN reaction causing immediate rash, facial/tongue/throat swelling, SOB or lightheadedness with hypotension: No Has patient had a PCN reaction causing severe rash involving mucus membranes or skin necrosis: No Has patient had a PCN reaction that required hospitalization No Has patient had a PCN reaction occurring within the last 10 years: No If all of the above answers are "NO", then may proceed with Cephalosporin use.   . Sulfa Antibiotics Rash    ROS:  The patient denies anorexia, fever, vision changes, decreased hearing, ear pain, sore throat, breast concerns, chest pain, palpitations, dizziness, syncope, dyspnea on exertion, cough, swelling, nausea, vomiting, diarrhea, constipation, abdominal pain, melena, hematochezia, indigestion/heartburn, hematuria, incontinence, dysuria, irregular menstrual cycles, vaginal discharge, odor or itch, genital lesions, joint pains, numbness, tingling, weakness, tremor, suspicious skin lesions, abnormal bleeding/bruising, or enlarged lymph nodes. Migraines, increased frequency per HPI Depression, inadequately controlled per HPI, anxiety has been okay. Weight has been stable Uses melatonin, which is effective in getting her to sleep   PHYSICAL EXAM:   BP 118/60   Pulse 74   Temp 97.8 F (36.6 C) (Other (Comment))   Ht 5' 4.5" (1.638 m)   Wt 131 lb 6.4 oz (59.6 kg)   LMP 06/24/2019   BMI 22.21 kg/m   Wt Readings from Last 3  Encounters:  01/18/19 132 lb (59.9 kg)  12/15/18 137 lb 12.8 oz (62.5 kg)  01/28/18 154 lb 9.6 oz (70.1 kg)    General Appearance:    Alert, cooperative, no distress, appears stated age. She did not change into gown for visit today.  Head:    Normocephalic, without obvious abnormality, atraumatic  Eyes:    PERRL, conjunctiva/corneas clear, EOM's intact, fundi benign  Ears:    Normal TM's and external ear canals  Nose:   Not examined, wearing mask due to COVID-19 pandemic  Throat:   Not examined, wearing mask due to COVID-19 pandemic  Neck:   Supple, no lymphadenopathy;  thyroid:  no enlargement/ tenderness/nodules; no carotid bruit or JVD  Back:    Spine nontender, no curvature, ROM normal, no CVA     tenderness  Lungs:     Clear to auscultation bilaterally without wheezes, rales or     ronchi; respirations unlabored  Chest Wall:    No tenderness or deformity   Heart:    Regular rate and rhythm, S1 and S2 normal, no murmur, rub or gallop  Breast Exam:    Deferred to GYN  Abdomen:     Soft, non-tender, nondistended, normoactive bowel sounds,  no masses, no hepatosplenomegaly  Genitalia:    Deferred to GYN     Extremities:   No clubbing, cyanosis or edema  Pulses:   2+ and symmetric all extremities  Skin:   Skin color, texture, turgor normal, no rashes or lesions  Lymph nodes:   Cervical, supraclavicular, and axillary nodes normal  Neurologic:   Normal strength, sensation and gait; reflexes 2+ and symmetric throughout          Psych:   Normal mood, affect, hygiene and grooming.    PHQ-9 score of 10  ASSESSMENT/PLAN:  Annual physical exam - Plan: CBC with Differential/Platelet, Comprehensive metabolic panel, Lipid panel, VITAMIN D 25 Hydroxy (Vit-D Deficiency, Fractures), TSH  Migraine with aura and without status migrainosus, not intractable - increase in frequency. Declines med change currently (due to other med changes); encouraged her to keep headache journal  Depression,  major, recurrent, moderate ([place]) - stop Prozac and change to Trintellix (no meds x 2 wks, then start 74m and reduce wellbutrin to 1562m titrate up to 1063mf tolerated). EAP/counseling rec - Plan: TSH, vortioxetine HBr (TRINTELLIX) 5 MG TABS tablet, vortioxetine HBr (TRINTELLIX) 10 MG TABS tablet, buPROPion (WELLBUTRIN XL) 150 MG 24 hr tablet  Generalized anxiety disorder - has been controlled with the SSRI  Medication monitoring encounter - Plan: CBC with Differential/Platelet, Comprehensive metabolic panel  Other fatigue - Plan: CBC with Differential/Platelet, Comprehensive metabolic panel, VITAMIN D 25 Hydroxy (Vit-D Deficiency, Fractures), TSH  Headache log Consider changes to migraine treatments in 1-2 months Briefly discussed meds such as Aimovig (and potentially could titrate the topamax down), also briefly discussed Nurtec trial, to see if more effective than the triptan for acute migraine. Also discussed possible referral to neuro (Dr. AheJaynee Eagles JafTomi LikensCbc, TSH, c-met, lipids D Printed and given to pt to send out from her office  Reminded to schedule mammogram, GYN exam, dental cleaning.  Discussed monthly self breast exams and yearly mammograms; at least 30 minutes of aerobic activity at least 5 days/week, weight-bearing exercise at least 2-3x/week; proper sunscreen use reviewed; healthy diet, including goals of calcium and vitamin D intake and alcohol recommendations (less than or equal to 1 drink/day) reviewed; regular seatbelt use; changing batteries in smoke detectors, carbon monoxide detector in the home.  Immunization recommendations discussed--continue yearly flu shots.  Colon cancer screening recommendations reviewed, due now. Discussed Cologuard and colonoscopy.  She will let us [place] which referral she prefers, and to which provider (if for colonoscopy).  No prozac x 2 weeks. Cont 300m63mllbutrin. Start trintellix at 5mg 19m week. Decrease to 150mg 61mbutrin Increase to  10mg a49mwards, if tolerated  F/u 6 weeks (can be virtual)

## 2019-07-26 NOTE — Patient Instructions (Addendum)
  HEALTH MAINTENANCE RECOMMENDATIONS:  It is recommended that you get at least 30 minutes of aerobic exercise at least 5 days/week (for weight loss, you may need as much as 60-90 minutes). This can be any activity that gets your heart rate up. This can be divided in 10-15 minute intervals if needed, but try and build up your endurance at least once a week.  Weight bearing exercise is also recommended twice weekly.  Eat a healthy diet with lots of vegetables, fruits and fiber.  "Colorful" foods have a lot of vitamins (ie green vegetables, tomatoes, red peppers, etc).  Limit sweet tea, regular sodas and alcoholic beverages, all of which has a lot of calories and sugar.  Up to 1 alcoholic drink daily may be beneficial for women (unless trying to lose weight, watch sugars).  Drink a lot of water.  Calcium recommendations are 1200-1500 mg daily (1500 mg for postmenopausal women or women without ovaries), and vitamin D 1000 IU daily.  This should be obtained from diet and/or supplements (vitamins), and calcium should not be taken all at once, but in divided doses.  Monthly self breast exams and yearly mammograms for women over the age of 87 is recommended.  Sunscreen of at least SPF 30 should be used on all sun-exposed parts of the skin when outside between the hours of 10 am and 4 pm (not just when at beach or pool, but even with exercise, golf, tennis, and yard work!)  Use a sunscreen that says "broad spectrum" so it covers both UVA and UVB rays, and make sure to reapply every 1-2 hours.  Remember to change the batteries in your smoke detectors when changing your clock times in the spring and fall. Carbon monoxide detectors are recommended for your home.  Use your seat belt every time you are in a car, and please drive safely and not be distracted with cell phones and texting while driving.  You are due for routine colon cancer screening.  We discussed colonoscopy and Cologuard.  Let me know which  referral you'd like, and if you have a preferred GI practice or doctor.  Please schedule your routine mammogram (3D preferably)  Schedule your routine GYN exam with Dr. Nelda Marseille. Schedule routine dental cleaning.  Keep a headache journal. We should consider changing your migraine management once we are okay with the other med changes we made today (to consider Aimovig, or other acute meds such as Nurtec if Frova isn't effective). Or we can refer you to neuro.  Stop prozac. After 2 weeks, start the 33m of Trintellix. Cont 3040mwellbutrin for the 2 weeks that you stop the prozac, but cut back to the new 15053mose once you start the 5mg4mintellix. Increase to 10mg60merward 1 week if tolerating it, and continue the 150mg 37mellbutrin.  Hoping we can taper that off at some point, if moods are better.   EAP/counseling

## 2019-07-28 ENCOUNTER — Ambulatory Visit (INDEPENDENT_AMBULATORY_CARE_PROVIDER_SITE_OTHER): Payer: BC Managed Care – PPO | Admitting: Family Medicine

## 2019-07-28 ENCOUNTER — Encounter: Payer: Self-pay | Admitting: Family Medicine

## 2019-07-28 ENCOUNTER — Other Ambulatory Visit: Payer: Self-pay

## 2019-07-28 VITALS — BP 118/60 | HR 74 | Temp 97.8°F | Ht 64.5 in | Wt 131.4 lb

## 2019-07-28 DIAGNOSIS — Z0000 Encounter for general adult medical examination without abnormal findings: Secondary | ICD-10-CM

## 2019-07-28 DIAGNOSIS — F411 Generalized anxiety disorder: Secondary | ICD-10-CM

## 2019-07-28 DIAGNOSIS — Z5181 Encounter for therapeutic drug level monitoring: Secondary | ICD-10-CM | POA: Diagnosis not present

## 2019-07-28 DIAGNOSIS — G43109 Migraine with aura, not intractable, without status migrainosus: Secondary | ICD-10-CM | POA: Diagnosis not present

## 2019-07-28 DIAGNOSIS — R5383 Other fatigue: Secondary | ICD-10-CM

## 2019-07-28 DIAGNOSIS — F331 Major depressive disorder, recurrent, moderate: Secondary | ICD-10-CM

## 2019-07-28 MED ORDER — BUPROPION HCL ER (XL) 150 MG PO TB24: 150.0000 mg | ORAL_TABLET | Freq: Every day | ORAL | 1 refills | Status: DC

## 2019-07-28 MED ORDER — VORTIOXETINE HBR 10 MG PO TABS: 10.0000 mg | ORAL_TABLET | Freq: Every day | ORAL | 0 refills | Status: DC

## 2019-07-28 MED ORDER — VORTIOXETINE HBR 5 MG PO TABS: 5.0000 mg | ORAL_TABLET | Freq: Every day | ORAL | 0 refills | Status: DC

## 2019-07-30 LAB — CBC WITH DIFFERENTIAL/PLATELET
Basophils Absolute: 0 10*3/uL (ref 0.0–0.2)
Basos: 1 %
EOS (ABSOLUTE): 0.1 10*3/uL (ref 0.0–0.4)
Eos: 1 %
Hematocrit: 39.7 % (ref 34.0–46.6)
Hemoglobin: 13.6 g/dL (ref 11.1–15.9)
Immature Grans (Abs): 0 10*3/uL (ref 0.0–0.1)
Immature Granulocytes: 0 %
Lymphocytes Absolute: 1.5 10*3/uL (ref 0.7–3.1)
Lymphs: 32 %
MCH: 32 pg (ref 26.6–33.0)
MCHC: 34.3 g/dL (ref 31.5–35.7)
MCV: 93 fL (ref 79–97)
Monocytes Absolute: 0.5 10*3/uL (ref 0.1–0.9)
Monocytes: 9 %
Neutrophils Absolute: 2.8 10*3/uL (ref 1.4–7.0)
Neutrophils: 57 %
Platelets: 264 10*3/uL (ref 150–450)
RBC: 4.25 x10E6/uL (ref 3.77–5.28)
RDW: 11.8 % (ref 11.7–15.4)
WBC: 4.9 10*3/uL (ref 3.4–10.8)

## 2019-07-30 LAB — LIPID PANEL
Chol/HDL Ratio: 2.9 ratio (ref 0.0–4.4)
Cholesterol, Total: 245 mg/dL — ABNORMAL HIGH (ref 100–199)
HDL: 84 mg/dL (ref >39)
LDL Chol Calc (NIH): 151 mg/dL — ABNORMAL HIGH (ref 0–99)
Triglycerides: 60 mg/dL (ref 0–149)
VLDL Cholesterol Cal: 10 mg/dL (ref 5–40)

## 2019-07-30 LAB — COMPREHENSIVE METABOLIC PANEL
ALT: 7 IU/L (ref 0–32)
AST: 14 IU/L (ref 0–40)
Albumin/Globulin Ratio: 2.6 — ABNORMAL HIGH (ref 1.2–2.2)
Albumin: 4.6 g/dL (ref 3.8–4.8)
Alkaline Phosphatase: 56 IU/L (ref 39–117)
BUN/Creatinine Ratio: 20 (ref 9–23)
BUN: 20 mg/dL (ref 6–24)
Bilirubin Total: 0.3 mg/dL (ref 0.0–1.2)
CO2: 20 mmol/L (ref 20–29)
Calcium: 9.2 mg/dL (ref 8.7–10.2)
Chloride: 107 mmol/L — ABNORMAL HIGH (ref 96–106)
Creatinine, Ser: 0.99 mg/dL (ref 0.57–1.00)
GFR calc Af Amer: 80 mL/min/{1.73_m2} (ref >59)
GFR calc non Af Amer: 69 mL/min/{1.73_m2} (ref >59)
Globulin, Total: 1.8 g/dL (ref 1.5–4.5)
Glucose: 78 mg/dL (ref 65–99)
Potassium: 4.4 mmol/L (ref 3.5–5.2)
Sodium: 140 mmol/L (ref 134–144)
Total Protein: 6.4 g/dL (ref 6.0–8.5)

## 2019-07-30 LAB — TSH: TSH: 1.64 u[IU]/mL (ref 0.450–4.500)

## 2019-07-30 LAB — SPECIMEN STATUS REPORT

## 2019-07-30 LAB — VITAMIN D 25 HYDROXY (VIT D DEFICIENCY, FRACTURES): Vit D, 25-Hydroxy: 26.1 ng/mL — ABNORMAL LOW (ref 30.0–100.0)

## 2019-08-22 ENCOUNTER — Encounter: Payer: Self-pay | Admitting: Family Medicine

## 2019-08-25 ENCOUNTER — Ambulatory Visit: Payer: Self-pay | Admitting: Family Medicine

## 2019-08-25 ENCOUNTER — Encounter: Payer: Self-pay | Admitting: Family Medicine

## 2019-09-07 ENCOUNTER — Other Ambulatory Visit: Payer: Self-pay | Admitting: Family Medicine

## 2019-09-07 DIAGNOSIS — R921 Mammographic calcification found on diagnostic imaging of breast: Secondary | ICD-10-CM

## 2019-09-14 NOTE — Progress Notes (Signed)
Start time: 4:19 End time: 5pm  Virtual Visit via Video Note  I connected with Megan Butler on 09/15/2019 by a video enabled telemedicine application and verified that I am speaking with the correct person using two identifiers.  Location: Patient: home Provider: office   I discussed the limitations of evaluation and management by telemedicine and the availability of in person appointments. The patient expressed understanding and agreed to proceed.  History of Present Illness:  Chief Complaint  Patient presents with  . Follow-up    VIRTUAL follow up on medication change. Patient states she is doing better. No new concerns.    Patient presents for follow-up on depression/anxiety and migraines.  She was last seen at her physical in March, at which time she was tapered off Prozac, started on Trintellix, and dose of Wellbutrin was decreased She has been on the Trineillix since about 4/8. She is currently taking wellbutrin XL 139m and Trintellix 141m  She is off Prozac. PHQ-9 score was 10 prior to med changes.  She feels like the change in medication is working well. Denies side effects. Feels very different--feels "muted" emotionally, thinks maybe the medication is too much Since her last visit, she has found a new job.  This past week has been particularly stressful, as she gave her notice to current job.  She will be starting new job in July or August.  She has some trouble sleeping--sometimes has trouble falling asleep, can't back to sleep if woken by kids, and sometimes wakes up at 4am for no reason.  Hard to say if she is truly tired during the day, vs just unmotivated, and then will doze off while on the couch.  Sometimes will fall asleep on the couch for 3 hours.  Migraines--at her last visit we discussed keeping headache log, and once psych meds stable, could consider changes in medications for migraine prevention. We discussed Aimovig, as well as Nurtec in place of triptan, as  well as neuro referral (AJaynee Eaglesr Jaffe)  Didn't have any headaches until 3 weeks ago, right before her cycle; cycle was "weird", came again 2 weeks later, and she got another headache.  Headaches every other day over the last week. LOTS of stress in past week, as reported above. Some weather changes. Ibuprofen took the edge off. No longer had any triptan at home, which doesn't work fully. She is willing to try something new for acute migraines.  She enrolled in a study for colonoscopy. She will let usKoreanow details.  PMH, PSH, SH reviewed  Outpatient Encounter Medications as of 09/15/2019  Medication Sig Note  . buPROPion (WELLBUTRIN XL) 150 MG 24 hr tablet Take 1 tablet (150 mg total) by mouth daily.   . Marland Kitchenopiramate (TOPAMAX) 100 MG tablet Take 1 tablet (100 mg total) by mouth daily.   . Marland Kitchenortioxetine HBr (TRINTELLIX) 10 MG TABS tablet Take 1 tablet (10 mg total) by mouth daily.   . [DISCONTINUED] vortioxetine HBr (TRINTELLIX) 10 MG TABS tablet Take 1 tablet (10 mg total) by mouth daily.   . Marland KitchenLPRAZolam (XANAX) 0.5 MG tablet Take 0.5-1 tablets (0.25-0.5 mg total) by mouth 3 (three) times daily as needed for anxiety or sleep. (Patient not taking: Reported on 07/28/2019) 07/28/2019: Ran out  . ibuprofen (ADVIL,MOTRIN) 600 MG tablet Take 1 tablet (600 mg total) by mouth every 6 (six) hours. (Patient not taking: Reported on 07/28/2019)   . [DISCONTINUED] vortioxetine HBr (TRINTELLIX) 5 MG TABS tablet Take 1 tablet (5 mg total) by mouth  daily.    No facility-administered encounter medications on file as of 09/15/2019.   Allergies  Allergen Reactions  . Penicillins Other (See Comments)    Has patient had a PCN reaction causing immediate rash, facial/tongue/throat swelling, SOB or lightheadedness with hypotension: No Has patient had a PCN reaction causing severe rash involving mucus membranes or skin necrosis: No Has patient had a PCN reaction that required hospitalization No Has patient had a PCN  reaction occurring within the last 10 years: No If all of the above answers are "NO", then may proceed with Cephalosporin use.   . Sulfa Antibiotics Rash   ROS:  No fever, chills, URI symptoms, nausea vomiting, bowel changes, chest pain.  +depression, trouble sleeping and migraines per HPI.     Observations/Objective:  BP 103/64   Pulse 68   Temp (!) 97.4 F (36.3 C) (Temporal)   Ht 5' 4.5" (1.638 m)   Wt 125 lb (56.7 kg)   LMP 09/10/2019 (Approximate)   SpO2 98%   BMI 21.12 kg/m  Wt Readings from Last 3 Encounters:  09/15/19 125 lb (56.7 kg)  07/28/19 131 lb 6.4 oz (59.6 kg)  01/18/19 132 lb (59.9 kg)   Pleasant, well appearing female, in good spirits. She is at home, on her couch and visited by her 2 cats during the visit. She has normal mood, affect, grooming, eye contact and speech. She is alert and oriented, cranial nerves grossly intact.  PHQ-9 score of 7   Assessment and Plan:  Migraine with aura and without status migrainosus, not intractable - Track headaches/triggers. Discussed Ubrelvy and Nurtec--will look into coupons/trial on websites, and let us know which she'd like rx to try  Depression, major, recurrent, moderate ([place]) - PHQ-9 down to 7 from 10, but she reports it is helping and she feels better; poss too strong. Stop Wellbutrin, cont 26m Trintellix, consider taper up if needed - Plan: vortioxetine HBr (TRINTELLIX) 10 MG TABS tablet   Stop wellbutrin.  Keep the Trintellix at 167m If you feel like moods are worsening, we can increase the Trintellix to 2076mIf you don't tolerate that, the other option is to restart the Wellbutrin XL 150m28md decrease the dose of trintellix to 5mg 60mllbutrin interacts with Trintellix, increasing the dose (so dose needs to be decreased). Consider stopping wellbutrin and increasing Trintellix to 20mg 31molerating but ineffective  Nurtec or Ubrevly--check to see which you'd like to start (you can compare coupons  offered on their websites, or check insurance, though that will be changing). This is the medication for acute migraines, to see if more effective than the Frova.  [employer] was the insurance that I mentioned might be affordable (maybe with high deductible, but better than nothing?)  Keep us updKoreaed on what's happening with your colonoscopy as part of the study (so we can document when you had it).    Follow Up Instructions:    I discussed the assessment and treatment plan with the patient. The patient was provided an opportunity to ask questions and all were answered. The patient agreed with the plan and demonstrated an understanding of the instructions.   The patient was advised to call back or seek an in-person evaluation if the symptoms worsen or if the condition fails to improve as anticipated.  I provided 41 minutes of non-face-to-face time during this encounter. Additional time spent in chart review and documentation.   Dagon Budai A Vikki Ports

## 2019-09-15 ENCOUNTER — Telehealth (INDEPENDENT_AMBULATORY_CARE_PROVIDER_SITE_OTHER): Payer: BC Managed Care – PPO | Admitting: Family Medicine

## 2019-09-15 ENCOUNTER — Other Ambulatory Visit: Payer: Self-pay

## 2019-09-15 ENCOUNTER — Encounter: Payer: Self-pay | Admitting: Family Medicine

## 2019-09-15 VITALS — BP 103/64 | HR 68 | Temp 97.4°F | Ht 64.5 in | Wt 125.0 lb

## 2019-09-15 DIAGNOSIS — G43109 Migraine with aura, not intractable, without status migrainosus: Secondary | ICD-10-CM | POA: Diagnosis not present

## 2019-09-15 DIAGNOSIS — F331 Major depressive disorder, recurrent, moderate: Secondary | ICD-10-CM

## 2019-09-15 MED ORDER — VORTIOXETINE HBR 10 MG PO TABS: 10.0000 mg | ORAL_TABLET | Freq: Every day | ORAL | 1 refills | Status: DC

## 2019-09-16 NOTE — Patient Instructions (Signed)
Stop wellbutrin.  Keep the Trintellix at 59m. If you feel like moods are worsening, we can increase the Trintellix to 255m If you don't tolerate that, the other option is to restart the Wellbutrin XL 15045mnd decrease the dose of trintellix to 5mg60mellbutrin interacts with Trintellix, increasing the dose (so dose needs to be decreased). Consider stopping wellbutrin and increasing Trintellix to 20mg70mtolerating but ineffective  Nurtec or Ubrevly--check to see which you'd like to start (you can compare coupons offered on their websites, or check insurance, though that will be changing). This is the medication for acute migraines, to see if more effective than the Frova.  [employer] was the insurance that I mentioned might be affordable (maybe with high deductible, but better than nothing?)  Keep us upKoreated on what's happening with your colonoscopy as part of the study (so we can document when you had it).

## 2019-09-23 ENCOUNTER — Other Ambulatory Visit: Payer: Self-pay | Admitting: Family Medicine

## 2019-09-23 ENCOUNTER — Ambulatory Visit
Admission: RE | Admit: 2019-09-23 | Discharge: 2019-09-23 | Disposition: A | Discharge status: Home / Self Care | Payer: BC Managed Care – PPO | Source: Ambulatory Visit | Attending: Family Medicine | Admitting: Family Medicine

## 2019-09-23 ENCOUNTER — Other Ambulatory Visit: Payer: Self-pay

## 2019-09-23 DIAGNOSIS — R921 Mammographic calcification found on diagnostic imaging of breast: Secondary | ICD-10-CM

## 2019-09-29 ENCOUNTER — Ambulatory Visit
Admission: RE | Admit: 2019-09-29 | Discharge: 2019-09-29 | Disposition: A | Discharge status: Home / Self Care | Payer: BC Managed Care – PPO | Source: Ambulatory Visit | Attending: Family Medicine | Admitting: Family Medicine

## 2019-09-29 ENCOUNTER — Other Ambulatory Visit: Payer: Self-pay

## 2019-09-29 ENCOUNTER — Encounter (INDEPENDENT_AMBULATORY_CARE_PROVIDER_SITE_OTHER): Payer: Self-pay

## 2019-09-29 DIAGNOSIS — R921 Mammographic calcification found on diagnostic imaging of breast: Secondary | ICD-10-CM

## 2019-09-30 ENCOUNTER — Other Ambulatory Visit: Payer: Self-pay | Admitting: Family Medicine

## 2019-09-30 DIAGNOSIS — N649 Disorder of breast, unspecified: Secondary | ICD-10-CM

## 2019-09-30 DIAGNOSIS — R921 Mammographic calcification found on diagnostic imaging of breast: Secondary | ICD-10-CM

## 2019-10-05 ENCOUNTER — Other Ambulatory Visit: Payer: Self-pay

## 2019-10-05 ENCOUNTER — Ambulatory Visit
Admission: RE | Admit: 2019-10-05 | Discharge: 2019-10-05 | Disposition: A | Discharge status: Home / Self Care | Payer: BC Managed Care – PPO | Source: Ambulatory Visit | Attending: Family Medicine | Admitting: Family Medicine

## 2019-10-05 DIAGNOSIS — N649 Disorder of breast, unspecified: Secondary | ICD-10-CM

## 2019-10-05 MED ORDER — GADOBUTROL 1 MMOL/ML IV SOLN
5.0000 mL | Freq: Once | INTRAVENOUS | Status: AC | PRN
  Administered 2019-10-05: 5 mL via INTRAVENOUS

## 2019-10-07 ENCOUNTER — Other Ambulatory Visit: Payer: Self-pay

## 2019-10-07 ENCOUNTER — Ambulatory Visit
Admission: RE | Admit: 2019-10-07 | Discharge: 2019-10-07 | Disposition: A | Discharge status: Home / Self Care | Payer: BC Managed Care – PPO | Source: Ambulatory Visit | Attending: Family Medicine | Admitting: Family Medicine

## 2019-10-07 DIAGNOSIS — R921 Mammographic calcification found on diagnostic imaging of breast: Secondary | ICD-10-CM

## 2019-10-21 ENCOUNTER — Other Ambulatory Visit: Payer: Self-pay | Admitting: Surgery

## 2019-10-21 DIAGNOSIS — N6021 Fibroadenosis of right breast: Secondary | ICD-10-CM

## 2019-10-31 ENCOUNTER — Other Ambulatory Visit: Payer: Self-pay | Admitting: Surgery

## 2019-10-31 DIAGNOSIS — N6021 Fibroadenosis of right breast: Secondary | ICD-10-CM

## 2019-11-21 NOTE — Progress Notes (Signed)
[facility] [address] [place] [address] Phone: [phone] Fax: [phone]  [id number] [place] [id number]: [phone]: [phone]     Your procedure is scheduled on Wednesday, July 21st.  [facility] at 6:30 A.M., and check in at the Admitting office.  Call this number if you have problems the morning of surgery:  [phone] Call [phone] you have any questions prior to your surgery date Monday-Friday 8am-4pm   Remember:  Do not eat after midnight the night before your surgery  You may drink clear liquids until 5:30 A.M. the morning of your surgery.   Clear liquids allowed are: Water, Non-Citrus Juices (without pulp), Carbonated Beverages, Clear Tea, Black Coffee Only, and Gatorade    Take these medicines the morning of surgery with A SIP OF WATER  topiramate (TOPAMAX)  vortioxetine HBr (TRINTELLIX)  Please complete your PRE-SURGERY ENSURE that was provided to you by 5:30 A.M..  Please, if able, drink it in one setting. DO NOT SIP.  As of today, STOP taking any Aspirin (unless otherwise instructed by your surgeon) Aleve, Naproxen, Ibuprofen, Motrin, Advil, Goody's, BC's, all herbal medications, fish oil, and all vitamins.                     Do not wear jewelry, make up, or nail polish            Do not wear lotions, powders, perfumes, or deodorant.            Do not shave 48 hours prior to surgery.            Do not bring valuables to the hospital.            [facility] or [facility].  Do NOT Smoke (Tobacco/Vaping) or drink Alcohol 24 hours prior to your procedure If you use a CPAP at night, you may bring all equipment for your overnight stay.   Contacts, glasses, dentures or bridgework may not be worn into surgery.      For patients admitted  to the hospital, discharge time will be determined by your treatment team.   Patients discharged the day of surgery will not be allowed to drive home, and someone needs to stay with them for 24 hours.  Special instructions:   [place]- Preparing For Surgery  Before surgery, you can play an important role. Because skin is not sterile, your skin needs to be as free of germs as possible. You can reduce the number of germs on your skin by washing with CHG (chlorahexidine gluconate) Soap before surgery.  CHG is an antiseptic cleaner which kills germs and bonds with the skin to continue killing germs even after washing.    Oral Hygiene is also important to reduce your risk of infection.  Remember - BRUSH YOUR TEETH THE MORNING OF SURGERY WITH YOUR REGULAR TOOTHPASTE  Please do not use if you have an allergy to CHG or antibacterial soaps. If your skin becomes reddened/irritated stop using the CHG.  Do not shave (including legs and underarms) for at least 48 hours prior to first CHG shower. It is OK to shave your face.  Please follow these instructions carefully.   1. Shower the NIGHT BEFORE SURGERY and the MORNING OF SURGERY with CHG Soap.   2. If  you chose to wash your hair, wash your hair first as usual with your normal shampoo.  3. After you shampoo, rinse your hair and body thoroughly to remove the shampoo.  4. Use CHG as you would any other liquid soap. You can apply CHG directly to the skin and wash gently with a scrungie or a clean washcloth.   5. Apply the CHG Soap to your body ONLY FROM THE NECK DOWN.  Do not use on open wounds or open sores. Avoid contact with your eyes, ears, mouth and genitals (private parts). Wash Face and genitals (private parts)  with your normal soap.   6. Wash thoroughly, paying special attention to the area where your surgery will be performed.  7. Thoroughly rinse your body with warm water from the neck down.  8. DO NOT shower/wash with your normal soap  after using and rinsing off the CHG Soap.  9. Pat yourself dry with a CLEAN TOWEL.  10. Wear CLEAN PAJAMAS to bed the night before surgery  11. Place CLEAN SHEETS on your bed the night of your first shower and DO NOT SLEEP WITH PETS.  Day of Surgery: Wear Clean/Comfortable clothing the morning of surgery Do not apply any deodorants/lotions.   Remember to brush your teeth WITH YOUR REGULAR TOOTHPASTE.   Please read over the following fact sheets that you were given.

## 2019-11-22 ENCOUNTER — Encounter (HOSPITAL_COMMUNITY)
Admission: RE | Admit: 2019-11-22 | Discharge: 2019-11-22 | Disposition: A | Discharge status: Home / Self Care | Payer: BC Managed Care – PPO | Source: Ambulatory Visit | Attending: Surgery | Admitting: Surgery

## 2019-11-22 ENCOUNTER — Encounter (HOSPITAL_COMMUNITY): Payer: Self-pay

## 2019-11-22 ENCOUNTER — Other Ambulatory Visit: Payer: Self-pay

## 2019-11-22 DIAGNOSIS — G43909 Migraine, unspecified, not intractable, without status migrainosus: Secondary | ICD-10-CM | POA: Diagnosis not present

## 2019-11-22 DIAGNOSIS — Z01812 Encounter for preprocedural laboratory examination: Secondary | ICD-10-CM | POA: Diagnosis present

## 2019-11-22 DIAGNOSIS — F329 Major depressive disorder, single episode, unspecified: Secondary | ICD-10-CM | POA: Insufficient documentation

## 2019-11-22 DIAGNOSIS — Z6821 Body mass index (BMI) 21.0-21.9, adult: Secondary | ICD-10-CM | POA: Diagnosis not present

## 2019-11-22 DIAGNOSIS — E7800 Pure hypercholesterolemia, unspecified: Secondary | ICD-10-CM | POA: Diagnosis not present

## 2019-11-22 DIAGNOSIS — Z87891 Personal history of nicotine dependence: Secondary | ICD-10-CM | POA: Insufficient documentation

## 2019-11-22 DIAGNOSIS — E663 Overweight: Secondary | ICD-10-CM | POA: Insufficient documentation

## 2019-11-22 LAB — CBC
HCT: 36.9 % (ref 36.0–46.0)
Hemoglobin: 12.6 g/dL (ref 12.0–15.0)
MCH: 31.6 pg (ref 26.0–34.0)
MCHC: 34.1 g/dL (ref 30.0–36.0)
MCV: 92.5 fL (ref 80.0–100.0)
Platelets: 217 10*3/uL (ref 150–400)
RBC: 3.99 MIL/uL (ref 3.87–5.11)
RDW: 11.5 % (ref 11.5–15.5)
WBC: 4.8 10*3/uL (ref 4.0–10.5)
nRBC: 0 % (ref 0.0–0.2)

## 2019-11-22 LAB — POCT PREGNANCY, URINE: Preg Test, Ur: NEGATIVE

## 2019-11-22 NOTE — Progress Notes (Signed)
PCP - Dr. Rita Ohara Cardiologist - denies  PPM/ICD - denies  Chest x-ray - N/A EKG - N/A Stress Test - denies  ECHO - denies Cardiac Cath - denies  Sleep Study - denies CPAP - N/A  DM: denies  Blood Thinner Instructions: N/A Aspirin Instructions: N/A  ERAS Protcol - Yes PRE-SURGERY Ensure or G2- Ensure given  COVID TEST- Scheduled for 11/26/2019. Patient verbalized understanding of self-quarantine instructions, appointment time and place.  Anesthesia review: YES, radioactive seed placement scheduled for 11/29/2019.  Patient denies shortness of breath, fever, cough and chest pain at PAT appointment  All instructions explained to the patient, with a verbal understanding of the material. Patient agrees to go over the instructions while at home for a better understanding. Patient also instructed to self quarantine after being tested for COVID-19. The opportunity to ask questions was provided.

## 2019-11-23 NOTE — Progress Notes (Signed)
Anesthesia Chart Review:  Case: [id number] Date/Time: 11/30/19 0815   Procedure: RIGHT BREAST LUMPECTOMY WITH RADIOACTIVE SEED LOCALIZATION (Right Breast) - LMA   Anesthesia type: General   Pre-op diagnosis: RIGHT BREAST COMPLEX SCLEROSING LESION   Location: [facility] OR ROOM 02 / [facility]   Surgeons: Coralie Keens, MD      DISCUSSION: Patient is a 44 year old female scheduled for the above procedure.  RSL scheduled for 11/29/2019, so urine pregnancy done at 11/22/19 PAT visit and was negative.  History includes former smoker (quit 05/12/98), ectopic pregnancy (2000, s/p surgery), hypercholesterolemia, depression, migraines.   09/29/19 right breast biopsy x2: 1) complex sclerosing lesion with calcifications . 2) fibrocystic change, sclerosing adenosis, and usual ductal hyperplasia with calcifications.  No malignancy identified.  10/07/19 right breast biopsy: Fibrocystic change and usual ductal hyperplasia with calcifications.  Periductal chronic inflammation.  Biopsy site changes.  No malignancy identified.  Presurgical COVID-19 test is scheduled for 11/26/2019.  Anesthesia team to evaluate on the day of surgery.   VS: BP 98/62   Pulse 66   Temp (!) 36 C (Axillary)   Resp 18   Ht 5' 4.5" (1.638 m)   Wt 58.7 kg   LMP 11/10/2019 (Approximate)   SpO2 100%   BMI 21.89 kg/m    PROVIDERS: Rita Ohara, MD is PCP   LABS: Labs reviewed: Acceptable for surgery. 07/29/19 CMET showed Cr 0.99, normal LFTs.   (all labs ordered are listed, but only abnormal results are displayed)  Labs Reviewed  CBC  POCT PREGNANCY, URINE    EKG: N/A   CV: Denied prior stress test, echocardiogram, and cardiac cath.   Past Medical History:  Diagnosis Date  . Depression 2007   tx'd x 6 months postpartum, recurred in 2009,  . Ectopic pregnancy 2000   and peritonitis--s/p surgery  . Migraine    menstrual  . Overweight   . Pure hypercholesterolemia   . Vaginal Pap smear, abnormal   . Vitamin D deficiency      Past Surgical History:  Procedure Laterality Date  . LAPAROSCOPY  2000   peritonitis, ectopic pregnancy  . LEEP    . WISDOM TOOTH EXTRACTION      MEDICATIONS: . buPROPion (WELLBUTRIN XL) 150 MG 24 hr tablet  . ibuprofen (ADVIL) 200 MG tablet  . topiramate (TOPAMAX) 100 MG tablet  . vortioxetine HBr (TRINTELLIX) 10 MG TABS tablet   No current facility-administered medications for this encounter.    Myra Gianotti, PA-C Surgical Short Stay/Anesthesiology [facility] Phone [phone] [facility] Phone [phone] 11/23/2019 2:24 PM

## 2019-11-23 NOTE — Anesthesia Preprocedure Evaluation (Addendum)
Anesthesia Evaluation  Patient identified by MRN, date of birth, ID band Patient awake    Reviewed: Allergy & Precautions, NPO status , Patient's Chart, lab work & pertinent test results  Airway Mallampati: II  TM Distance: >3 FB Neck ROM: Full    Dental  (+) Teeth Intact, Dental Advisory Given   Pulmonary former smoker,    breath sounds clear to auscultation       Cardiovascular  Rhythm:Regular Rate:Normal     Neuro/Psych    GI/Hepatic   Endo/Other    Renal/GU      Musculoskeletal   Abdominal   Peds  Hematology   Anesthesia Other Findings   Reproductive/Obstetrics                            Anesthesia Physical Anesthesia Plan  ASA: II  Anesthesia Plan: General   Post-op Pain Management:    Induction:   PONV Risk Score and Plan: Ondansetron and Dexamethasone  Airway Management Planned:   Additional Equipment:   Intra-op Plan:   Post-operative Plan: Extubation in OR  Informed Consent: I have reviewed the patients History and Physical, chart, labs and discussed the procedure including the risks, benefits and alternatives for the proposed anesthesia with the patient or authorized representative who has indicated his/her understanding and acceptance.     Dental advisory given  Plan Discussed with: CRNA and Anesthesiologist  Anesthesia Plan Comments: (PAT note written 11/23/2019 by Myra Gianotti, PA-C. )       Anesthesia Quick Evaluation

## 2019-11-25 ENCOUNTER — Other Ambulatory Visit: Payer: Self-pay

## 2019-11-25 DIAGNOSIS — G43109 Migraine with aura, not intractable, without status migrainosus: Secondary | ICD-10-CM

## 2019-11-25 MED ORDER — TOPIRAMATE 100 MG PO TABS: 100.0000 mg | ORAL_TABLET | Freq: Every day | ORAL | 1 refills | Status: DC

## 2019-11-25 NOTE — Telephone Encounter (Signed)
I now see that Rx was just sent to [facility] by Maudie Mercury (at [phone]).  I also now see that Quita Skye put the info into contact information regarding the pharmacy, however per Kim's message, she states she didn't request this pharmacy. [id number] - [place], [place] (Pharmacy)  Very confusing messages.  But at least now it appears that she has gotten 6 months of meds refilled, and if she chooses to change pharmacy we can do that when requested.

## 2019-11-25 NOTE — Telephone Encounter (Signed)
I saw that you refused to HT, saying I sent in rx elsewhere. HT didn't request rx, it was from a phone encounter/message.  I tried to refill it, but couldn't find the pharmacy, so sent the message to you to please send in to the proper pharmacy as pended (since you could find the fax or ask Quita Skye, I couldn't find requested pharmacy).  If someone did send in the refill as requested to [facility], it is not documented in her chart and needs to be. I just want to make sure that she doesn't run out of her medication.  I intend for her to get a 90 x 1RF sent in, but did NOT do it myself, as stated in my message.  Thanks

## 2019-11-25 NOTE — Telephone Encounter (Signed)
Received fax from [facility] for a refill on the pts. Topiramate 100 mg pt. Last apt was 09/15/19.

## 2019-11-25 NOTE — Telephone Encounter (Signed)
See message below. [place]

## 2019-11-25 NOTE — Telephone Encounter (Signed)
Please advise if topiramate. Please advise . [place]

## 2019-11-25 NOTE — Telephone Encounter (Signed)
Okay for refill as pended (#90 x 1 refill).  There are many [facility] listed in [place], none on her list, so check the fax and make sure that it gets sent to the proper pharmacy. Also, she needs to schedule a med check for September (sooner if not doing well regarding moods or headaches)--currently in the midst of job transition and surgery scheduled next week.

## 2019-11-25 NOTE — Telephone Encounter (Signed)
Appt was made. [place]

## 2019-11-25 NOTE — Telephone Encounter (Signed)
It appeared earlier that you had sent it in. Pt stated that she did not request anything from [facility]. Per previous message script was sent into [facility] and med check appt made for September. Have a great weekend.   [place]

## 2019-11-25 NOTE — Addendum Note (Signed)
Addended by: Rita Ohara on: 11/25/2019 09:48 AM   Modules accepted: Orders

## 2019-11-26 ENCOUNTER — Other Ambulatory Visit (HOSPITAL_COMMUNITY)
Admission: RE | Admit: 2019-11-26 | Discharge: 2019-11-26 | Disposition: A | Discharge status: Home / Self Care | Payer: BC Managed Care – PPO | Source: Ambulatory Visit | Attending: Surgery | Admitting: Surgery

## 2019-11-26 DIAGNOSIS — Z20822 Contact with and (suspected) exposure to covid-19: Secondary | ICD-10-CM | POA: Insufficient documentation

## 2019-11-26 DIAGNOSIS — Z01818 Encounter for other preprocedural examination: Secondary | ICD-10-CM | POA: Diagnosis not present

## 2019-11-26 LAB — SARS CORONAVIRUS 2 (TAT 6-24 HRS): SARS Coronavirus 2: NEGATIVE

## 2019-11-29 ENCOUNTER — Other Ambulatory Visit: Payer: Self-pay

## 2019-11-29 ENCOUNTER — Telehealth: Payer: Self-pay | Admitting: Internal Medicine

## 2019-11-29 ENCOUNTER — Ambulatory Visit
Admission: RE | Admit: 2019-11-29 | Discharge: 2019-11-29 | Disposition: A | Discharge status: Home / Self Care | Payer: BC Managed Care – PPO | Source: Ambulatory Visit | Attending: Surgery | Admitting: Surgery

## 2019-11-29 DIAGNOSIS — N6021 Fibroadenosis of right breast: Secondary | ICD-10-CM

## 2019-11-29 MED ORDER — ALPRAZOLAM 0.5 MG PO TABS: 0.2500 mg | ORAL_TABLET | Freq: Three times a day (TID) | ORAL | 0 refills | Status: DC | PRN

## 2019-11-29 NOTE — Telephone Encounter (Signed)
Last filled #15 in 04/2018.  Refill sent and patient notified

## 2019-11-29 NOTE — Telephone Encounter (Signed)
Pt called and having surgery tomorrow and would like a refill on xanax for her aniexty. She is very nervous about the surgery. Please send to [place] a ref[place] from [place]amate 155m [place] Per pt she [place] not [facility]. Disgarding request and just FYI in chart for further notice

## 2019-11-29 NOTE — H&P (Signed)
Megan Butler  Location: [facility] Patient #: [id number] DOB: 1975/11/06 Married / Language: English / Race: White Female   History of Present Illness  The patient is a 44 year old female who presents with a complaint of Breast problems.  Chief complaint: Complex sclerosing lesion right breast  This is a 43 year old female who presents with right breast calcifications. She had a 6 cm area found on mammography. She had 3 areas biopsied with 2 showing fibrocystic changes and 1 showing a complex sclerosing lesion. No malignancy was identified. She had a follow-up MRI showing no evidence of malignancy in the right breast. Removal of the complex sclerosing lesion has been recommended. She does have occasional spontaneous right nipple discharge which is clear she is having since childbirth. She has had no other previous problems with her breast or need for biopsies. There is no family history of breast cancer. She is otherwise healthy without complaints.   Diagnostic Studies History Malachi Bonds, CMA;  Colonoscopy  never Mammogram  >3 years ago Pap Smear  1-5 years ago  Allergies Malachi Bonds, CMA;  Penicillins  Sulfa Drugs  Allergies Reconciled   Medication History Malachi Bonds, CMA;  Trintellix (10MG Tablet, Oral) Active. Topiramate (100MG Tablet, Oral) Active. Medications Reconciled  Social History Malachi Bonds, CMA;  Alcohol use  Occasional alcohol use. Caffeine use  Coffee. No drug use  Tobacco use  Former smoker.  Family History Malachi Bonds, CMA; Depression  Mother, Sister. Migraine Headache  Mother, Sister. Thyroid problems  Mother.  Pregnancy / Birth History Malachi Bonds, CMA;  Age at menarche  1 years. Gravida  4 Length (months) of breastfeeding  12-24 Maternal age  40-30 Para  4 Regular periods   Other Problems Malachi Bonds, CMA;  Anxiety Disorder  Depression  Hypercholesterolemia      Review of Systems Malachi Bonds CMA; General Not Present- Appetite Loss, Chills, Fatigue, Fever, Night Sweats, Weight Gain and Weight Loss. Skin Not Present- Change in Wart/Mole, Dryness, Hives, Jaundice, New Lesions, Non-Healing Wounds, Rash and Ulcer. HEENT Not Present- Earache, Hearing Loss, Hoarseness, Nose Bleed, Oral Ulcers, Ringing in the Ears, Seasonal Allergies, Sinus Pain, Sore Throat, Visual Disturbances, Wears glasses/contact lenses and Yellow Eyes. Respiratory Not Present- Bloody sputum, Chronic Cough, Difficulty Breathing, Snoring and Wheezing. Breast Not Present- Breast Mass, Breast Pain, Nipple Discharge and Skin Changes. Cardiovascular Not Present- Chest Pain, Difficulty Breathing Lying Down, Leg Cramps, Palpitations, Rapid Heart Rate, Shortness of Breath and Swelling of Extremities. Gastrointestinal Not Present- Abdominal Pain, Bloating, Bloody Stool, Change in Bowel Habits, Chronic diarrhea, Constipation, Difficulty Swallowing, Excessive gas, Gets full quickly at meals, Hemorrhoids, Indigestion, Nausea, Rectal Pain and Vomiting. Female Genitourinary Not Present- Frequency, Nocturia, Painful Urination, Pelvic Pain and Urgency. Musculoskeletal Not Present- Back Pain, Joint Pain, Joint Stiffness, Muscle Pain, Muscle Weakness and Swelling of Extremities. Neurological Not Present- Decreased Memory, Fainting, Headaches, Numbness, Seizures, Tingling, Tremor, Trouble walking and Weakness. Psychiatric Not Present- Anxiety, Bipolar, Change in Sleep Pattern, Depression, Fearful and Frequent crying. Endocrine Not Present- Cold Intolerance, Excessive Hunger, Hair Changes, Heat Intolerance, Hot flashes and New Diabetes. Hematology Not Present- Blood Thinners, Easy Bruising, Excessive bleeding, Gland problems, HIV and Persistent Infections.  Vitals  Weight: 132.2 lb Height: 65in Body Surface Area: 1.66 m Body Mass Index: 22 kg/m  Temp.: 98.33F  Pulse: 108 (Regular)  BP:  128/74(Sitting, Left Arm, Standard)       Physical Exam The physical exam findings are as follows: Note: She appears well on exam  There are no palpable masses in the right breast although there is some fullness from the previous biopsy. She has no ecchymosis. There is only one tiny lymph node palpable in the right axilla. There is no arm swelling.    Assessment & Plan   COMPLEX SCLEROSING LESION OF RIGHT BREAST (N64.89)  Impression: I have reviewed her mammograms, pathology results, an MRI of her breasts. Again, she has a 6 mL area of calcifications with only one small area showing a complex sclerosing lesion and the other side fibrocystic changes. The MRI was otherwise unremarkable. I gave her a copy of the pathology results. It is recommended that she have a radioactive seed guided right breast lumpectomy to remove the area of the complex sclerosing lesion for complete histologic evaluation to rule out malignancy. I discussed procedure with her in detail. We discussed the risk which includes but is not limited to bleeding, infection, and the need for further surgery malignancy is present. We discussed postoperative recovery as well and continued follow-up. At this point, she wishes to proceed with surgery which will be scheduled.

## 2019-11-30 ENCOUNTER — Ambulatory Visit (HOSPITAL_COMMUNITY)
Admission: RE | Admit: 2019-11-30 | Discharge: 2019-11-30 | Disposition: A | Discharge status: Home / Self Care | Payer: BC Managed Care – PPO | Attending: Surgery | Admitting: Surgery

## 2019-11-30 ENCOUNTER — Ambulatory Visit
Admission: RE | Admit: 2019-11-30 | Discharge: 2019-11-30 | Disposition: A | Discharge status: Home / Self Care | Payer: BC Managed Care – PPO | Source: Ambulatory Visit | Attending: Surgery | Admitting: Surgery

## 2019-11-30 ENCOUNTER — Encounter (HOSPITAL_COMMUNITY): Payer: Self-pay | Admitting: Surgery

## 2019-11-30 ENCOUNTER — Ambulatory Visit (HOSPITAL_COMMUNITY): Payer: BC Managed Care – PPO | Admitting: Anesthesiology

## 2019-11-30 ENCOUNTER — Other Ambulatory Visit: Payer: Self-pay

## 2019-11-30 ENCOUNTER — Ambulatory Visit (HOSPITAL_COMMUNITY): Payer: BC Managed Care – PPO | Admitting: Vascular Surgery

## 2019-11-30 ENCOUNTER — Encounter (HOSPITAL_COMMUNITY)
Admission: RE | Disposition: A | Discharge status: Home / Self Care | Payer: Self-pay | Source: Home / Self Care | Attending: Surgery

## 2019-11-30 DIAGNOSIS — N6021 Fibroadenosis of right breast: Secondary | ICD-10-CM

## 2019-11-30 DIAGNOSIS — F329 Major depressive disorder, single episode, unspecified: Secondary | ICD-10-CM | POA: Insufficient documentation

## 2019-11-30 DIAGNOSIS — N6489 Other specified disorders of breast: Secondary | ICD-10-CM | POA: Insufficient documentation

## 2019-11-30 DIAGNOSIS — Z87891 Personal history of nicotine dependence: Secondary | ICD-10-CM | POA: Insufficient documentation

## 2019-11-30 DIAGNOSIS — F419 Anxiety disorder, unspecified: Secondary | ICD-10-CM | POA: Insufficient documentation

## 2019-11-30 DIAGNOSIS — Z79899 Other long term (current) drug therapy: Secondary | ICD-10-CM | POA: Insufficient documentation

## 2019-11-30 HISTORY — PX: BREAST LUMPECTOMY WITH RADIOACTIVE SEED LOCALIZATION: SHX6424

## 2019-11-30 SURGERY — BREAST LUMPECTOMY WITH RADIOACTIVE SEED LOCALIZATION
Anesthesia: General | Site: Breast | Laterality: Right

## 2019-11-30 MED ORDER — LIDOCAINE 2% (20 MG/ML) 5 ML SYRINGE
INTRAMUSCULAR | Status: DC | PRN
  Administered 2019-11-30: 60 mg via INTRAVENOUS

## 2019-11-30 MED ORDER — PROPOFOL 10 MG/ML IV BOLUS
INTRAVENOUS | Status: DC | PRN
  Administered 2019-11-30: 170 mg via INTRAVENOUS

## 2019-11-30 MED ORDER — BUPIVACAINE HCL (PF) 0.25 % IJ SOLN
INTRAMUSCULAR | Status: AC
  Filled 2019-11-30: qty 30

## 2019-11-30 MED ORDER — BUPIVACAINE HCL (PF) 0.25 % IJ SOLN
INTRAMUSCULAR | Status: DC | PRN
  Administered 2019-11-30: 20 mL

## 2019-11-30 MED ORDER — LACTATED RINGERS IV SOLN: INTRAVENOUS | Status: DC

## 2019-11-30 MED ORDER — ONDANSETRON HCL 4 MG/2ML IJ SOLN: 4.0000 mg | Freq: Once | INTRAMUSCULAR | Status: DC | PRN

## 2019-11-30 MED ORDER — CIPROFLOXACIN IN D5W 400 MG/200ML IV SOLN
400.0000 mg | INTRAVENOUS | Status: AC
  Administered 2019-11-30: 400 mg via INTRAVENOUS
  Filled 2019-11-30: qty 200

## 2019-11-30 MED ORDER — MIDAZOLAM HCL 5 MG/5ML IJ SOLN
INTRAMUSCULAR | Status: DC | PRN
  Administered 2019-11-30: 2 mg via INTRAVENOUS

## 2019-11-30 MED ORDER — ENSURE PRE-SURGERY PO LIQD: 296.0000 mL | Freq: Once | ORAL | Status: DC

## 2019-11-30 MED ORDER — FENTANYL CITRATE (PF) 100 MCG/2ML IJ SOLN: 25.0000 ug | INTRAMUSCULAR | Status: DC | PRN

## 2019-11-30 MED ORDER — 0.9 % SODIUM CHLORIDE (POUR BTL) OPTIME
TOPICAL | Status: DC | PRN
  Administered 2019-11-30: 1000 mL

## 2019-11-30 MED ORDER — KETOROLAC TROMETHAMINE 15 MG/ML IJ SOLN
15.0000 mg | INTRAMUSCULAR | Status: DC
  Filled 2019-11-30: qty 1

## 2019-11-30 MED ORDER — MIDAZOLAM HCL 2 MG/2ML IJ SOLN
INTRAMUSCULAR | Status: AC
  Filled 2019-11-30: qty 2

## 2019-11-30 MED ORDER — ACETAMINOPHEN 500 MG PO TABS
1000.0000 mg | ORAL_TABLET | ORAL | Status: AC
  Administered 2019-11-30: 1000 mg via ORAL
  Filled 2019-11-30: qty 2

## 2019-11-30 MED ORDER — OXYCODONE HCL 5 MG PO TABS
5.0000 mg | ORAL_TABLET | Freq: Once | ORAL | Status: AC | PRN
  Administered 2019-11-30: 5 mg via ORAL

## 2019-11-30 MED ORDER — OXYCODONE HCL 5 MG PO TABS: 5.0000 mg | ORAL_TABLET | Freq: Four times a day (QID) | ORAL | 0 refills | Status: DC | PRN

## 2019-11-30 MED ORDER — PROPOFOL 10 MG/ML IV BOLUS
INTRAVENOUS | Status: AC
  Filled 2019-11-30: qty 20

## 2019-11-30 MED ORDER — OXYCODONE HCL 5 MG/5ML PO SOLN: 5.0000 mg | Freq: Once | ORAL | Status: AC | PRN

## 2019-11-30 MED ORDER — GABAPENTIN 300 MG PO CAPS
300.0000 mg | ORAL_CAPSULE | ORAL | Status: AC
  Administered 2019-11-30: 300 mg via ORAL
  Filled 2019-11-30: qty 1

## 2019-11-30 MED ORDER — CHLORHEXIDINE GLUCONATE CLOTH 2 % EX PADS: 6.0000 | MEDICATED_PAD | Freq: Once | CUTANEOUS | Status: DC

## 2019-11-30 MED ORDER — ORAL CARE MOUTH RINSE: 15.0000 mL | Freq: Once | OROMUCOSAL | Status: AC

## 2019-11-30 MED ORDER — CHLORHEXIDINE GLUCONATE 0.12 % MT SOLN
15.0000 mL | Freq: Once | OROMUCOSAL | Status: AC
  Administered 2019-11-30: 15 mL via OROMUCOSAL
  Filled 2019-11-30: qty 15

## 2019-11-30 MED ORDER — KETOROLAC TROMETHAMINE 30 MG/ML IJ SOLN
INTRAMUSCULAR | Status: DC | PRN
Start: 2019-11-30 — End: 2019-11-30
  Administered 2019-11-30: 30 mg via INTRAVENOUS

## 2019-11-30 MED ORDER — FENTANYL CITRATE (PF) 100 MCG/2ML IJ SOLN
INTRAMUSCULAR | Status: DC | PRN
  Administered 2019-11-30: 50 ug via INTRAVENOUS

## 2019-11-30 MED ORDER — ONDANSETRON HCL 4 MG/2ML IJ SOLN
INTRAMUSCULAR | Status: DC | PRN
  Administered 2019-11-30: 4 mg via INTRAVENOUS

## 2019-11-30 MED ORDER — DEXAMETHASONE SODIUM PHOSPHATE 4 MG/ML IJ SOLN
INTRAMUSCULAR | Status: DC | PRN
  Administered 2019-11-30: 4 mg via INTRAVENOUS

## 2019-11-30 MED ORDER — FENTANYL CITRATE (PF) 250 MCG/5ML IJ SOLN
INTRAMUSCULAR | Status: AC
  Filled 2019-11-30: qty 5

## 2019-11-30 MED ORDER — OXYCODONE HCL 5 MG PO TABS
ORAL_TABLET | ORAL | Status: AC
  Filled 2019-11-30: qty 1

## 2019-11-30 SURGICAL SUPPLY — 32 items
APPLIER CLIP 9.375 MED OPEN (MISCELLANEOUS) ×2
BINDER BREAST LRG (GAUZE/BANDAGES/DRESSINGS) ×2 IMPLANT
BINDER BREAST XLRG (GAUZE/BANDAGES/DRESSINGS) IMPLANT
CANISTER SUCT 3000ML PPV (MISCELLANEOUS) ×2 IMPLANT
CHLORAPREP W/TINT 26 (MISCELLANEOUS) ×2 IMPLANT
CLIP APPLIE 9.375 MED OPEN (MISCELLANEOUS) ×1 IMPLANT
COVER PROBE W GEL 5X96 (DRAPES) ×2 IMPLANT
COVER SURGICAL LIGHT HANDLE (MISCELLANEOUS) ×2 IMPLANT
COVER WAND RF STERILE (DRAPES) ×2 IMPLANT
DERMABOND ADVANCED (GAUZE/BANDAGES/DRESSINGS) ×1
DERMABOND ADVANCED .7 DNX12 (GAUZE/BANDAGES/DRESSINGS) ×1 IMPLANT
DEVICE DUBIN SPECIMEN MAMMOGRA (MISCELLANEOUS) ×2 IMPLANT
DRAPE CHEST BREAST 15X10 FENES (DRAPES) ×2 IMPLANT
DRSG PAD ABDOMINAL 8X10 ST (GAUZE/BANDAGES/DRESSINGS) ×2 IMPLANT
ELECT CAUTERY BLADE 6.4 (BLADE) ×2 IMPLANT
ELECT REM PT RETURN 9FT ADLT (ELECTROSURGICAL) ×2
ELECTRODE REM PT RTRN 9FT ADLT (ELECTROSURGICAL) ×1 IMPLANT
GLOVE SURG SIGNA 7.5 PF LTX (GLOVE) ×2 IMPLANT
GOWN STRL REUS W/ TWL LRG LVL3 (GOWN DISPOSABLE) ×1 IMPLANT
GOWN STRL REUS W/ TWL XL LVL3 (GOWN DISPOSABLE) ×1 IMPLANT
GOWN STRL REUS W/TWL LRG LVL3 (GOWN DISPOSABLE) ×1
GOWN STRL REUS W/TWL XL LVL3 (GOWN DISPOSABLE) ×1
KIT BASIN OR (CUSTOM PROCEDURE TRAY) ×2 IMPLANT
KIT MARKER MARGIN INK (KITS) ×2 IMPLANT
NEEDLE HYPO 25GX1X1/2 BEV (NEEDLE) ×2 IMPLANT
NS IRRIG 1000ML POUR BTL (IV SOLUTION) IMPLANT
PACK GENERAL/GYN (CUSTOM PROCEDURE TRAY) ×2 IMPLANT
SUT MNCRL AB 4-0 PS2 18 (SUTURE) ×2 IMPLANT
SUT VIC AB 3-0 SH 18 (SUTURE) ×2 IMPLANT
SYR CONTROL 10ML LL (SYRINGE) ×2 IMPLANT
TOWEL GREEN STERILE (TOWEL DISPOSABLE) ×2 IMPLANT
TOWEL GREEN STERILE FF (TOWEL DISPOSABLE) ×2 IMPLANT

## 2019-11-30 NOTE — Op Note (Signed)
RIGHT BREAST LUMPECTOMY WITH RADIOACTIVE SEED LOCALIZATION  Procedure Note  Megan Butler 11/30/2019   Pre-op Diagnosis: RIGHT BREAST COMPLEX SCLEROSING LESION     Post-op Diagnosis: same  Procedure(s): RIGHT BREAST LUMPECTOMY WITH RADIOACTIVE SEED LOCALIZATION  Surgeon(s): Coralie Keens, MD  Anesthesia: General  Staff:  Circulator: Rometta Emery, RN; Celene Squibb, RN Scrub Person: Lois Huxley  Estimated Blood Loss: Minimal               Specimens: sent to path  Indications: This is a 44 year old female was found to have multiple calcifications in a greater than 6 cm area of the right breast.  She had 3 different areas biopsied.  2 showed fibrocystic changes and the third showed a complex sclerosing lesion.  The decision was made to proceed with a radioactive seed guided lumpectomy to remove the complex sclerosing lesion  Findings: As I was performing a lumpectomy, the radioactive seed came out of the specimen and was removed separately.  The initial lumpectomy specimen did not have the biopsy clip but I was able to visualize it and remove a second more medial piece of tissue with the original biopsy clip.  All tissue was submitted for evaluation  Procedure: The patient was brought to operating identifies correct patient.  She is placed upon the operating table general anesthesia was induced.  Her right breast was prepped and draped in usual sterile fashion.  I anesthetized the skin at the lateral edge of the areola with Marcaine and then made a circumareolar incision with a scalpel.  I then dissected down toward the breast tissue and laterally with the cautery.  With the aid of neoprobe I was able to identify the radioactive seed in the deep lateral breast tissue.  While I was performing a lumpectomy, a small bridging artery was identified.  I had to clip this vessel to achieve hemostasis.  The radioactive seed then came out of the specimen as I was dissecting further.   I removed the seed and placed in a cup.  I then completed the lumpectomy with cautery.  I marked the margins of the tissue with paint and then we x-rayed the specimen.  I did not see the original biopsy clip so I took more medial tissue which I could visualize the clip in without having to x-ray further.  The original lumpectomy and the second tissue with and sent to pathology for evaluation.  The second tissue containing the clip was more medial.  The dissection was only down to the chest wall.  I achieved hemostasis in the bridging vessel with surgical clips.  I anesthetized the incision further with Marcaine.  Hemostasis appeared to be achieved.  I then closed the subcutaneous tissue with interrupted 3-0 Vicryl sutures and closed the skin with a running 4-0 Monocryl.  Dermabond was then applied.  The patient tolerated the procedure well.  All the counts were correct at the end of the procedure.  The patient was then extubated in the operating room and taken in stable addition to the recovery room.          Coralie Keens   Date: 11/30/2019  Time: 9:11 AM

## 2019-11-30 NOTE — Anesthesia Procedure Notes (Signed)
Procedure Name: LMA Insertion Date/Time: 11/30/2019 8:30 AM Performed by: Amadeo Garnet, CRNA Pre-anesthesia Checklist: Patient identified, Emergency Drugs available, Suction available and Patient being monitored Patient Re-evaluated:Patient Re-evaluated prior to induction Oxygen Delivery Method: Circle system utilized Induction Type: IV induction Ventilation: Mask ventilation without difficulty LMA: LMA inserted LMA Size: 4.0 Placement Confirmation: positive ETCO2 and breath sounds checked- equal and bilateral Tube secured with: Tape Dental Injury: Teeth and Oropharynx as per pre-operative assessment

## 2019-11-30 NOTE — Transfer of Care (Signed)
Immediate Anesthesia Transfer of Care Note  Patient: Megan Butler  Procedure(s) Performed: RIGHT BREAST LUMPECTOMY WITH RADIOACTIVE SEED LOCALIZATION (Right Breast)  Patient Location: PACU  Anesthesia Type:General  Level of Consciousness: awake, alert  and oriented  Airway & Oxygen Therapy: Patient Spontanous Breathing  Post-op Assessment: Report given to RN, Post -op Vital signs reviewed and stable and Patient moving all extremities  Post vital signs: Reviewed and stable  Last Vitals:  Vitals Value Taken Time  BP 87/52 11/30/19 0922  Temp    Pulse 62 11/30/19 0922  Resp 11 11/30/19 0922  SpO2 100 % 11/30/19 0922  Vitals shown include unvalidated device data.  Last Pain:  Vitals:   11/30/19 0706  TempSrc: Oral  PainSc: 0-No pain         Complications: No complications documented.

## 2019-11-30 NOTE — Anesthesia Postprocedure Evaluation (Signed)
Anesthesia Post Note  Patient: Megan Butler  Procedure(s) Performed: RIGHT BREAST LUMPECTOMY WITH RADIOACTIVE SEED LOCALIZATION (Right Breast)     Patient location during evaluation: PACU Anesthesia Type: General Level of consciousness: awake and alert Pain management: pain level controlled Vital Signs Assessment: post-procedure vital signs reviewed and stable Respiratory status: spontaneous breathing, nonlabored ventilation, respiratory function stable and patient connected to nasal cannula oxygen Cardiovascular status: blood pressure returned to baseline and stable Postop Assessment: no apparent nausea or vomiting Anesthetic complications: no   No complications documented.  Last Vitals:  Vitals:   11/30/19 1010 11/30/19 1030  BP: (!) 100/59 115/73  Pulse: 66 70  Resp: 13 12  Temp:  36.4 C  SpO2: 100% 100%    Last Pain:  Vitals:   11/30/19 1010  TempSrc:   PainSc: 6                  Jiyah Torpey COKER

## 2019-11-30 NOTE — Interval H&P Note (Signed)
History and Physical Interval Note: no change in H and P  11/30/2019 7:55 AM  Megan Butler  has presented today for surgery, with the diagnosis of RIGHT BREAST COMPLEX SCLEROSING LESION.  The various methods of treatment have been discussed with the patient and family. After consideration of risks, benefits and other options for treatment, the patient has consented to  Procedure(s) with comments: RIGHT BREAST LUMPECTOMY WITH RADIOACTIVE SEED LOCALIZATION (Right) - LMA as a surgical intervention.  The patient's history has been reviewed, patient examined, no change in status, stable for surgery.  I have reviewed the patient's chart and labs.  Questions were answered to the patient's satisfaction.     Coralie Keens

## 2019-11-30 NOTE — Discharge Instructions (Signed)
[place] Office Phone Number [phone]  BREAST BIOPSY/ PARTIAL MASTECTOMY: POST OP INSTRUCTIONS  Always review your discharge instruction sheet given to you by the facility where your surgery was performed.  IF YOU HAVE DISABILITY OR FAMILY LEAVE FORMS, YOU MUST BRING THEM TO THE OFFICE FOR PROCESSING.  DO NOT GIVE THEM TO YOUR DOCTOR.  1. A prescription for pain medication may be given to you upon discharge.  Take your pain medication as prescribed, if needed.  If narcotic pain medicine is not needed, then you may take acetaminophen (Tylenol) or ibuprofen (Advil) as needed. 2. Take your usually prescribed medications unless otherwise directed 3. If you need a refill on your pain medication, please contact your pharmacy.  They will contact our office to request authorization.  Prescriptions will not be filled after 5pm or on week-ends. 4. You should eat very light the first 24 hours after surgery, such as soup, crackers, pudding, etc.  Resume your normal diet the day after surgery. 5. Most patients will experience some swelling and bruising in the breast.  Ice packs and a good support bra will help.  Swelling and bruising can take several days to resolve.  6. It is common to experience some constipation if taking pain medication after surgery.  Increasing fluid intake and taking a stool softener will usually help or prevent this problem from occurring.  A mild laxative (Milk of Magnesia or Miralax) should be taken according to package directions if there are no bowel movements after 48 hours. 7. Unless discharge instructions indicate otherwise, you may remove your bandages 24-48 hours after surgery, and you may shower at that time.  You may have steri-strips (small skin tapes) in place directly over the incision.  These strips should be left on the skin for 7-10 days.  If your surgeon used skin glue on the incision, you may shower in 24 hours.  The glue will flake off over the  next 2-3 weeks.  Any sutures or staples will be removed at the office during your follow-up visit. 8. ACTIVITIES:  You may resume regular daily activities (gradually increasing) beginning the next day.  Wearing a good support bra or sports bra minimizes pain and swelling.  You may have sexual intercourse when it is comfortable. a. You may drive when you no longer are taking prescription pain medication, you can comfortably wear a seatbelt, and you can safely maneuver your car and apply brakes. b. RETURN TO WORK:  ______________________________________________________________________________________ 9. You should see your doctor in the office for a follow-up appointment approximately two weeks after your surgery.  Your doctors nurse will typically make your follow-up appointment when she calls you with your pathology report.  Expect your pathology report 2-3 business days after your surgery.  You may call to check if you do not hear from [place] after three days. 10. OTHER INSTRUCTIONS:OK TO REMOVE THE BINDER AND SHOWER STARTING TOMORROW 11. ICE PACK, TYLENOL, AND IBUPROFEN ALSO FOR PAIN 12. NO VIGOROUS ACTIVITY FOR ONE WEEK _______________________________________________________________________________________________ _____________________________________________________________________________________________________________________________________ _____________________________________________________________________________________________________________________________________ _____________________________________________________________________________________________________________________________________  WHEN TO CALL YOUR DOCTOR: 1. Fever over 101.0 2. Nausea and/or vomiting. 3. Extreme swelling or bruising. 4. Continued bleeding from incision. 5. Increased pain, redness, or drainage from the incision.  The clinic staff is available to answer your questions during regular business hours.   Please dont hesitate to call and ask to speak to one of the nurses for clinical concerns.  If you have a medical emergency, go to the nearest emergency room or  call 911.  A surgeon from [facility] is always on call at the hospital.  For further questions, please visit [email or web address]

## 2019-12-01 ENCOUNTER — Encounter (HOSPITAL_COMMUNITY): Payer: Self-pay | Admitting: Surgery

## 2019-12-01 LAB — SURGICAL PATHOLOGY

## 2020-01-06 ENCOUNTER — Other Ambulatory Visit: Payer: Self-pay

## 2020-01-06 ENCOUNTER — Other Ambulatory Visit: Payer: Self-pay | Admitting: Critical Care Medicine

## 2020-01-06 DIAGNOSIS — Z20822 Contact with and (suspected) exposure to covid-19: Secondary | ICD-10-CM

## 2020-01-07 LAB — SARS-COV-2, NAA 2 DAY TAT

## 2020-01-07 LAB — NOVEL CORONAVIRUS, NAA: SARS-CoV-2, NAA: NOT DETECTED

## 2020-01-11 ENCOUNTER — Other Ambulatory Visit: Payer: Self-pay

## 2020-01-11 DIAGNOSIS — Z20822 Contact with and (suspected) exposure to covid-19: Secondary | ICD-10-CM

## 2020-01-12 LAB — NOVEL CORONAVIRUS, NAA: SARS-CoV-2, NAA: NOT DETECTED

## 2020-02-07 DIAGNOSIS — E559 Vitamin D deficiency, unspecified: Secondary | ICD-10-CM | POA: Insufficient documentation

## 2020-02-07 DIAGNOSIS — F411 Generalized anxiety disorder: Secondary | ICD-10-CM | POA: Insufficient documentation

## 2020-02-07 NOTE — Progress Notes (Signed)
Chief Complaint  Patient presents with  . Migraine    med check. Has discoloration on right arm, been there since mid summer. No other concerns.   . Immunizations    going to have covid booster 10/13 as part of study and will get flu shot at work.    Patient presents for f/u on depression, anxiety and migraines.  She has noted a discoloration on her R arm since mid-summer. Started after her surgery.  It started at a linear discoloration, and then spread distally to the wrist.  No associated itching, discomfort, no known burn, trauma.  She treated with hydrocortisone, Head and Shoulders shampoo.  No real change.  At her virtual visit in May, her PHQ-9 score went down to 7 from 10, after changing her regimen from prozac to trintellix.  She felt like maybe it was too strong, so we suggested a trial of stopping the Wellbutrin, continuing the 58m Trintellix, but consider increasing to 232mif not adequately managing her moods. She continues on Trintellix alone. Brought in paperwork for patient assistance program for Trintellix.  Was able to use the copay card twice, but no longer working, and is expensive (no prescription coverage). "I feel good" Denies feeling depressed or anxious.  Since her last visit she changed jobs.   It has been a transition, with a large learning curve, moving back to primary care, with new computer and computer system, with supervising physician micromanaging things.  She had R breast lumpectomy in 11/2019 (path showed fibrocystic change with usual ductal hyperplasia, negative for carcinoma). Reports this has healed fine--took more tissue than needed, so somewhat misshapen, but not too bothered by it.  Migraines: Taking topamax for prevention. She is currently taking 10039maily.  Denies side effects. Gets migraines on Mondays, likely related to stress.  Ibuprofen helps enough, until she goes to sleep. The migraines related to her cycles (just before and after her cycle) do  not respond to the ibuprofen, and she wakes up with them. Frova has expired, still has some.  This takes the edge off, doesn't always make the headaches go away. Previously did well with Midrin for prn use, no longer available.  Fioricet wasn't helpful. We had discussed potential to try Ubrelvy or Nurtec for acute headaches at last visit.    Vitamin D deficiency:  Level was 26.1 in 07/2019.  She was advised to start at 2000 IU daily for a month or two, then could take 1000 IU daily, especially over spring/summer, possibly bumping up again for fall/winter months. She admits she hasn't been taking it regularly (the bottle isn't upstairs with the rest of her meds).  She had been considering participating in a colonoscopy study.  It required she have a colonoscopy first, and didn't have time, or someone to take her.    PMH, PSH, SH reviewed  Outpatient Encounter Medications as of 02/08/2020  Medication Sig  . ALPRAZolam (XANAX) 0.5 MG tablet Take 0.5-1 tablets (0.25-0.5 mg total) by mouth 3 (three) times daily as needed for anxiety.  . topiramate (TOPAMAX) 100 MG tablet Take 1 tablet (100 mg total) by mouth daily.  . vMarland Kitchenrtioxetine HBr (TRINTELLIX) 10 MG TABS tablet Take 1 tablet (10 mg total) by mouth daily.  . iMarland Kitchenuprofen (ADVIL) 200 MG tablet Take 400-800 mg by mouth every 6 (six) hours as needed for moderate pain. (Patient not taking: Reported on 02/08/2020)  . [DISCONTINUED] oxyCODONE (OXY IR/ROXICODONE) 5 MG immediate release tablet Take 1 tablet (5 mg total) by  mouth every 6 (six) hours as needed for moderate pain or severe pain.   No facility-administered encounter medications on file as of 02/08/2020.   Allergies  Allergen Reactions  . Penicillins Other (See Comments)    Syncope Has patient had a PCN reaction causing immediate rash, facial/tongue/throat swelling, SOB or lightheadedness with hypotension: No Has patient had a PCN reaction causing severe rash involving mucus membranes or skin  necrosis: No Has patient had a PCN reaction that required hospitalization No Has patient had a PCN reaction occurring within the last 10 years: No If all of the above answers are "NO", then may proceed with Cephalosporin use.   . Sulfa Antibiotics Rash    ROS: no fever, chills, URI symptoms, chest pain, shortness of breath, GI complaints. Moods are good.  Hyperpigmentation on R forearm.  Headaches per HPI. No other issues/complaints.   PHYSICAL EXAM:  BP 100/64   Pulse 72   Ht 5' 4.5" (1.638 m)   Wt 129 lb (58.5 kg)   BMI 21.80 kg/m   Wt Readings from Last 3 Encounters:  02/08/20 129 lb (58.5 kg)  11/30/19 129 lb 8 oz (58.7 kg)  11/22/19 129 lb 8 oz (58.7 kg)   Well developed, pleasant female in no distress HEENT: conjunctiva and sclera are clear, EOMI. Wearing mask She is alert, oriented, normal strength, gait Psych:  Normal mood, affect, hygiene and grooming. Normal eye contact and speech. Skin: Right forearm--hyperpigmented macules, one linear, in the mid-forearm.  The others are more circular.  These are flat, no scaling, flaking, erythema.  PHQ-9 score of 0 GAD-7 score of 1    ASSESSMENT/PLAN:  Depression, major, recurrent, moderate (HCC) - Doing very well on Trintellix.  Given #28 samples, and FFO for 1 year through [place], to be sent to her home - Plan: vortioxetine HBr (TRINTELLIX) 10 MG TABS tablet  Generalized anxiety disorder - well controlled with Trintellix  Migraine with aura and without status migrainosus, not intractable - continue topamax. Given samples of Ubrelvy and Nurtec to try with her next migraines around her menses - Plan: Rimegepant Sulfate (NURTEC) 75 MG TBDP, Ubrogepant (UBRELVY) 100 MG TABS  Vitamin D deficiency - encouraged to take D supplement more regularly--to keep with her other meds  Colon cancer screening - discussed options including Cologuard, colonoscopy.  Encouraged her to schedule colonoscopy (screening)   nurtec 75 #2 ubrelvy  172m #1 trintellix 123m#28 samples given   You are due for colon cancer screening. Double check with your insurance that the Cologuard would be covered at your age. Consider colonoscopy (knowing that IF the cologuard was +, the colonoscopy would then cost more.)  Please start taking 2000 IU of D3 daily--bring it upstairs and take along with your other medication.  F/u at CPE, with fasting labs prior. D, lipids, Cbc, c-met

## 2020-02-08 ENCOUNTER — Encounter: Payer: Self-pay | Admitting: Family Medicine

## 2020-02-08 ENCOUNTER — Telehealth: Payer: Self-pay

## 2020-02-08 ENCOUNTER — Other Ambulatory Visit: Payer: Self-pay

## 2020-02-08 ENCOUNTER — Ambulatory Visit (INDEPENDENT_AMBULATORY_CARE_PROVIDER_SITE_OTHER): Payer: 59 | Admitting: Family Medicine

## 2020-02-08 VITALS — BP 100/64 | HR 72 | Ht 64.5 in | Wt 129.0 lb

## 2020-02-08 DIAGNOSIS — F411 Generalized anxiety disorder: Secondary | ICD-10-CM | POA: Diagnosis not present

## 2020-02-08 DIAGNOSIS — G43109 Migraine with aura, not intractable, without status migrainosus: Secondary | ICD-10-CM | POA: Diagnosis not present

## 2020-02-08 DIAGNOSIS — F331 Major depressive disorder, recurrent, moderate: Secondary | ICD-10-CM | POA: Diagnosis not present

## 2020-02-08 DIAGNOSIS — E559 Vitamin D deficiency, unspecified: Secondary | ICD-10-CM

## 2020-02-08 DIAGNOSIS — Z5181 Encounter for therapeutic drug level monitoring: Secondary | ICD-10-CM

## 2020-02-08 DIAGNOSIS — Z1211 Encounter for screening for malignant neoplasm of colon: Secondary | ICD-10-CM

## 2020-02-08 DIAGNOSIS — Z0000 Encounter for general adult medical examination without abnormal findings: Secondary | ICD-10-CM

## 2020-02-08 NOTE — Telephone Encounter (Signed)
Pt. Was checking out I scheduled her for her CPE on 08/23/19 and she wanted to know if she could get a written order to have her lab work done at her office through [place]. She wanted to have it done before her apt. So she could discuss the results with you at her apt.

## 2020-02-08 NOTE — Telephone Encounter (Signed)
Done and mailed to patient.

## 2020-02-08 NOTE — Patient Instructions (Signed)
You are due for colon cancer screening. Double check with your insurance that the Cologuard would be covered at your age. Consider colonoscopy (knowing that IF the cologuard was +, the colonoscopy would then cost more.)  Please start taking 2000 IU of D3 daily--bring it upstairs and take along with your other medication.  Keep the area on the arm well protected from sun. If it starts changing/spreading, let me know.  You were given samples of both Nurtec and Ubrelvy to try with your next migraines. If these are helpful, let me know which you'd like to try and get through the pharmacy.

## 2020-02-08 NOTE — Telephone Encounter (Signed)
Please write out order (on rx? We don't have [facility] order sheets, right?) C-met, CBC, lipid panel and Vitamin D-OH Dx for CPE, pure hypercholesterolemia and vit D deficiency.

## 2020-02-09 ENCOUNTER — Telehealth: Payer: Self-pay | Admitting: Family Medicine

## 2020-02-09 DIAGNOSIS — F331 Major depressive disorder, recurrent, moderate: Secondary | ICD-10-CM

## 2020-02-09 NOTE — Telephone Encounter (Signed)
Recv'd Pt Assistance paperwork Trintellix,  We are missing income verification, left message for pt

## 2020-02-10 ENCOUNTER — Encounter: Payer: Self-pay | Admitting: Family Medicine

## 2020-02-10 MED ORDER — VORTIOXETINE HBR 10 MG PO TABS: 10.0000 mg | ORAL_TABLET | Freq: Every day | ORAL | 0 refills | Status: DC

## 2020-02-10 MED ORDER — NURTEC 75 MG PO TBDP: 75.0000 mg | ORAL_TABLET | Freq: Every day | ORAL | 0 refills | Status: AC | PRN

## 2020-02-10 MED ORDER — UBRELVY 100 MG PO TABS: 100.0000 mg | ORAL_TABLET | Freq: Every day | ORAL | 0 refills | Status: AC | PRN

## 2020-03-08 NOTE — Telephone Encounter (Signed)
Left another message need income verification

## 2020-03-15 ENCOUNTER — Other Ambulatory Visit: Payer: Self-pay | Admitting: Family Medicine

## 2020-03-15 DIAGNOSIS — F331 Major depressive disorder, recurrent, moderate: Secondary | ICD-10-CM

## 2020-03-15 NOTE — Telephone Encounter (Signed)
Is this okay to refill? I saw something in chart about patient assistance so wasn't sure if this needed to go to HT?

## 2020-03-15 NOTE — Telephone Encounter (Signed)
I sent patient a note asking if she needs this vs if she was able to get Patient Assistance. I guess I'll also route to Mickel Baas to see if she got any more info--last note on the chart was from over a month ago.

## 2020-03-16 ENCOUNTER — Telehealth: Payer: Self-pay

## 2020-03-16 MED ORDER — VORTIOXETINE HBR 10 MG PO TABS: 10.0000 mg | ORAL_TABLET | Freq: Every day | ORAL | 0 refills | Status: DC

## 2020-03-16 NOTE — Telephone Encounter (Signed)
No not yet.  I left pt 2 messages that additional information was needed to complete Patient Assistance application and I haven't heard back from her

## 2020-03-16 NOTE — Telephone Encounter (Signed)
error 

## 2020-03-16 NOTE — Telephone Encounter (Signed)
Called pt again and she apologized for not calling back, she will email me the income verification for the application.  I asked if she needed medication and she has a few left.  Can we give pt samples to hold her until this is approved?

## 2020-03-16 NOTE — Telephone Encounter (Signed)
Yes, please give samples.  Thanks

## 2020-03-16 NOTE — Telephone Encounter (Signed)
Called pt and informed samples ready to pick up and she is going to just drop off income verification when she picks up samples

## 2020-04-09 ENCOUNTER — Telehealth: Payer: Self-pay

## 2020-04-09 NOTE — Telephone Encounter (Signed)
Left message for pt, she still hasn't picked up samples or dropped off income info for pt assistance

## 2020-04-12 NOTE — Telephone Encounter (Signed)
Income info received by email.  Pt  Assistance [employer] application completed and faxed it

## 2020-04-14 NOTE — Telephone Encounter (Signed)
Recv'd approval for Pt Assistance til 04/13/21

## 2020-05-08 NOTE — Telephone Encounter (Signed)
Pt assistance approved til 04/13/21, left message for pt

## 2020-05-11 NOTE — Telephone Encounter (Signed)
done

## 2020-05-23 ENCOUNTER — Other Ambulatory Visit: Payer: Self-pay | Admitting: Family Medicine

## 2020-05-23 DIAGNOSIS — G43109 Migraine with aura, not intractable, without status migrainosus: Secondary | ICD-10-CM

## 2020-06-02 NOTE — Telephone Encounter (Signed)
done

## 2020-06-06 ENCOUNTER — Other Ambulatory Visit: Payer: Self-pay | Admitting: Family Medicine

## 2020-06-06 DIAGNOSIS — G43109 Migraine with aura, not intractable, without status migrainosus: Secondary | ICD-10-CM

## 2020-06-06 MED ORDER — ALPRAZOLAM 0.5 MG PO TABS: 0.2500 mg | ORAL_TABLET | Freq: Three times a day (TID) | ORAL | 0 refills | Status: AC | PRN

## 2020-06-06 NOTE — Telephone Encounter (Signed)
You refilled her topamax on 1/12, unless I'm missing something. Alprazolam refilled.

## 2020-06-06 NOTE — Telephone Encounter (Signed)
Is this okay to refill? 

## 2020-08-21 ENCOUNTER — Encounter: Payer: Self-pay | Admitting: Family Medicine

## 2020-08-21 NOTE — Progress Notes (Signed)
Chief Complaint  Patient presents with  . Annual Exam    Nonfasting annual exam, pretty sure she had a pap with Dr. Nelda Marseille last year (2021). Could not give UA. Thinks she may have ADD.     Megan Butler is a 45 y.o. female who presents for a complete physical. She sees Dr. Nelda Marseille for GYN.   Depression and anxiety:  She is taking Trintellix. She was doing very well on this per last visit 6 months ago, no side effects. PHQ-9 score was 0, and GAD-7 score was 1.  Since that visit, she reports she has been unhappy at her job, found a new one.  Has been a little anxious regarding the change. She gave notice, and will start her new job in [employer] on 5/2. She continues to have ongoing custody issues, court dates. [name] has primary custody of their 3 children--still needs to request visitation every week, still gets 50%. Merrily Pew is doing better--has a job (and she is on his insurance currently), and is sober.  Migraines: Taking topamax 136m daily for prevention.She denies side effects. She has had increased stress x 2 mos, having some more migraines (a few days/week lately).  Typically she gets migraines on Mondays, likely related to stress.  Ibuprofen helps enough, until she goes to sleep. The migraines related to her cycles (just before and after her cycle) do not respond to the ibuprofen, and she wakes up with them. In place of Frava (which didn't completely resolve the headache), she was given samples of Ubrelvy and Nurtec at last visit to try. This has worked well.  She has been getting samples at work of whichever is available (590mUbrelvy, 757murtec), both work well. (previously did well with Midrin prn, but no longer available; fioricet wasn't helpful).  Vitamin D deficiency:  Level was 26.1 in 07/2019.  She was advised to start at 2000 IU daily for a month or two, then could take 1000 IU daily, especially over spring/summer, possibly bumping up again for fall/winter months. At her last visit she  reported taking D supplement only sporadically. Today she admits that she hasn't taken any in many months.  Colon cancer screening--never checked to see if cologuard covered. Currently has insurance through spouse--should last until new insurance effective (August).  Borderline hyperlipidemia noted last year.   Lipids were rechecked at [facility]--she recalls LDL was 120's, total 220's, HDL 70-80's (nonfasting) Diet: red meat once/week, +cheese, infrequent eggs.  Lab Results  Component Value Date   CHOL 245 (H) 07/29/2019   HDL 84 07/29/2019   LDLCALC 151 (H) 07/29/2019   TRIG 60 07/29/2019   CHOLHDL 2.9 07/29/2019     Immunization History  Administered Date(s) Administered  . Influenza Split 02/10/2012, 03/02/2013  . Influenza,inj,Quad PF,6+ Mos 02/13/2015, 01/28/2018, 03/27/2019  . Influenza-Unspecified 02/11/2016, 03/10/2017, 02/09/2020  . PFIZER(Purple Top)SARS-COV-2 Vaccination 01/11/2019, 02/01/2019, 02/22/2020  . Tdap 01/11/2016   +HepB titers in the past Last Pap smear: 04/2016, normal, no high risk HPV. Pt states she had another pap in the last 1-2 years with Dr. OzaNelda Marseilleeportedly normal. Last mammogram: 09/2019 (with biopsy on R 11/2019) Last colonoscopy: never Last DEXA: never Dentist:  Up to date Ophtho: up to date.  Wears contacts Exercise: Very little, hasn't had time in the last few months. She is charting until late at night, can't wake up early. Plans to get back on track with a better schedule with her new job--has stair stepper, elliptical and treadmill, plus a [employer] membership  Has resistance bands at home. Her older kids [facility] to the Y, plans to go with them on the weeks she has them.   PMH, PSH, SH and FH were reviewed and updated  Outpatient Encounter Medications as of 08/22/2020  Medication Sig Note  . Melatonin 5 MG CHEW Chew 1 tablet by mouth as needed. 08/22/2020: Takes prn  . topiramate (TOPAMAX) 100 MG tablet TAKE ONE TABLET BY MOUTH DAILY   .  vortioxetine HBr (TRINTELLIX) 10 MG TABS tablet Take 1 tablet (10 mg total) by mouth daily.   Marland Kitchen ALPRAZolam (XANAX) 0.5 MG tablet Take 0.5-1 tablets (0.25-0.5 mg total) by mouth 3 (three) times daily as needed for anxiety. (Patient not taking: Reported on 08/22/2020)   . ibuprofen (ADVIL) 200 MG tablet Take 400-800 mg by mouth every 6 (six) hours as needed for moderate pain. (Patient not taking: No sig reported)   . Rimegepant Sulfate (NURTEC) 75 MG TBDP Take 75 mg by mouth daily as needed (migraine headache). (Patient not taking: Reported on 08/22/2020)   . Ubrogepant (UBRELVY) 100 MG TABS Take 100 mg by mouth daily as needed (migraine headache). (Patient not taking: Reported on 08/22/2020) 08/22/2020: Has 60m samples that she has been taking, and effective   No facility-administered encounter medications on file as of 08/22/2020.   Allergies  Allergen Reactions  . Penicillins Other (See Comments)    Syncope Has patient had a PCN reaction causing immediate rash, facial/tongue/throat swelling, SOB or lightheadedness with hypotension: No Has patient had a PCN reaction causing severe rash involving mucus membranes or skin necrosis: No Has patient had a PCN reaction that required hospitalization No Has patient had a PCN reaction occurring within the last 10 years: No If all of the above answers are "NO", then may proceed with Cephalosporin use.   . Sulfa Antibiotics Rash    ROS:  The patient denies anorexia, fever, vision changes, decreased hearing, ear pain, sore throat, breast concerns, chest pain, palpitations, dizziness, syncope, dyspnea on exertion, cough, swelling, nausea, vomiting, diarrhea, constipation, abdominal pain, melena, hematochezia, indigestion/heartburn, hematuria, incontinence, dysuria, vaginal discharge, odor or itch, genital lesions, joint pains, numbness, tingling, weakness, tremor, suspicious skin lesions, abnormal bleeding/bruising, or enlarged lymph nodes. Migraines, per  HPI Moods per HPI Some weight gain since last visit Some insomnia, uses melatonin, which is effective in getting her to sleep Cycles are somewhat irregular--very light, every 3 months. Some night sweats, intermittently.   PHYSICAL EXAM:  BP 118/78   Pulse 68   Ht 5' 5"  (1.651 m)   Wt 138 lb 9.6 oz (62.9 kg)   LMP 08/19/2020 (Exact Date)   BMI 23.06 kg/m   Wt Readings from Last 3 Encounters:  08/22/20 138 lb 9.6 oz (62.9 kg)  02/08/20 129 lb (58.5 kg)  11/30/19 129 lb 8 oz (58.7 kg)    General Appearance:    Alert, cooperative, no distress, appears stated age. She did not change into gown for visit today.  Head:    Normocephalic, without obvious abnormality, atraumatic  Eyes:    PERRL, conjunctiva/corneas clear, EOM's intact, fundi benign  Ears:    Normal TM's and external ear canals  Nose:   Not examined, wearing mask due to COVID-19 pandemic  Throat:   Not examined, wearing mask due to COVID-19 pandemic  Neck:   Supple, no lymphadenopathy;  thyroid:  no enlargement/ tenderness/nodules; no carotid bruit or JVD  Back:    Spine nontender, no curvature, ROM normal, no CVA  tenderness  Lungs:     Clear to auscultation bilaterally without wheezes, rales or     ronchi; respirations unlabored  Chest Wall:    No tenderness or deformity   Heart:    Regular rate and rhythm, S1 and S2 normal, no murmur, rub or gallop  Breast Exam:    Deferred to GYN  Abdomen:     Soft, non-tender, nondistended, normoactive bowel sounds,  no masses, no hepatosplenomegaly  Genitalia:    Deferred to GYN     Extremities:   No clubbing, cyanosis or edema  Pulses:   2+ and symmetric all extremities  Skin:   Skin color, texture, turgor normal, no rashes or lesions  Lymph nodes:   Cervical, supraclavicular, and axillary nodes normal  Neurologic:   Normal strength, sensation and gait; reflexes 2+ and symmetric throughout          Psych:   Normal mood, affect, hygiene and  grooming.     ASSESSMENT/PLAN:  Annual physical exam - Plan: Comprehensive metabolic panel, VITAMIN D 25 Hydroxy (Vit-D Deficiency, Fractures), CBC with Differential/Platelet, Lipid panel  Generalized anxiety disorder - some recent stressors; looking forward to job change to decrease stress (still has court/custody). 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 Cont Trintellix  Colon cancer screening - Cologuard - Plan: Cologuard  Pure hypercholesterolemia - recheck today. Low cholesterol diet  Medication monitoring encounter - Plan: Comprehensive metabolic panel, VITAMIN D 25 Hydroxy (Vit-D Deficiency, Fractures), CBC with Differential/Platelet  Vitamin D deficiency - noncompliant with supplements. Re-treat with Rx (level again low); take daily supplement upon completion - Plan: VITAMIN D 25 Hydroxy (Vit-D Deficiency, Fractures), Vitamin D, Ergocalciferol, (DRISDOL) 1.25 MG (50000 UNIT) CAPS capsule  Migraine with aura and without status migrainosus, not intractable - Continue Topamax.  Cont ibuprofen at early onset of HA, Ubrelvy or Nurtec prn. - Plan: topiramate (TOPAMAX) 100 MG tablet  Still sexually active with Josh; declines STD testing/concerns.  At the end of the visit she mentioned some difficulty with focus, memory, more noticeable in the last 3-4 months. She has had increased stress during this time, and some worse sleep (though melatonin helps).  Discussed the potential causes of these symptoms--doubtful that she is having ADHD newly diagnosed, likely more related to stress, poor sleep.  If having ongoing issues, recommend psych evaluation.  Discussed monthly self breast exams and yearly mammograms; at least 30 minutes of aerobic activity at least 5 days/week, weight-bearing exercise at least 2-3x/week; proper sunscreen use reviewed; healthy diet, including goals of calcium and vitamin D intake and alcohol recommendations (less than or equal to 1  drink/day) reviewed; regular seatbelt use; changing batteries in smoke detectors, carbon monoxide detector in the home.  Immunization recommendations discussed--continue yearly flu shots.  Colon cancer screening recommendations reviewed, due now. Discussed Cologuard and colonoscopy.  Referred for cologuard--to verify her insurance coverage (<50) prior to returning specimen.   F/u 1 year, sooner prn (worsening moods or migraines)

## 2020-08-21 NOTE — Patient Instructions (Signed)
  HEALTH MAINTENANCE RECOMMENDATIONS:  It is recommended that you get at least 30 minutes of aerobic exercise at least 5 days/week (for weight loss, you may need as much as 60-90 minutes). This can be any activity that gets your heart rate up. This can be divided in 10-15 minute intervals if needed, but try and build up your endurance at least once a week.  Weight bearing exercise is also recommended twice weekly.  Eat a healthy diet with lots of vegetables, fruits and fiber.  "Colorful" foods have a lot of vitamins (ie green vegetables, tomatoes, red peppers, etc).  Limit sweet tea, regular sodas and alcoholic beverages, all of which has a lot of calories and sugar.  Up to 1 alcoholic drink daily may be beneficial for women (unless trying to lose weight, watch sugars).  Drink a lot of water.  Calcium recommendations are 1200-1500 mg daily (1500 mg for postmenopausal women or women without ovaries), and vitamin D 1000 IU daily.  This should be obtained from diet and/or supplements (vitamins), and calcium should not be taken all at once, but in divided doses.  Monthly self breast exams and yearly mammograms for women over the age of 90 is recommended.  Sunscreen of at least SPF 30 should be used on all sun-exposed parts of the skin when outside between the hours of 10 am and 4 pm (not just when at beach or pool, but even with exercise, golf, tennis, and yard work!)  Use a sunscreen that says "broad spectrum" so it covers both UVA and UVB rays, and make sure to reapply every 1-2 hours.  Remember to change the batteries in your smoke detectors when changing your clock times in the spring and fall. Carbon monoxide detectors are recommended for your home.  Use your seat belt every time you are in a car, and please drive safely and not be distracted with cell phones and texting while driving.

## 2020-08-22 ENCOUNTER — Encounter: Payer: Self-pay | Admitting: Family Medicine

## 2020-08-22 ENCOUNTER — Other Ambulatory Visit: Payer: Self-pay

## 2020-08-22 ENCOUNTER — Ambulatory Visit (INDEPENDENT_AMBULATORY_CARE_PROVIDER_SITE_OTHER): Payer: No Typology Code available for payment source | Admitting: Family Medicine

## 2020-08-22 VITALS — BP 118/78 | HR 68 | Ht 65.0 in | Wt 138.6 lb

## 2020-08-22 DIAGNOSIS — E7800 Pure hypercholesterolemia, unspecified: Secondary | ICD-10-CM

## 2020-08-22 DIAGNOSIS — Z1211 Encounter for screening for malignant neoplasm of colon: Secondary | ICD-10-CM

## 2020-08-22 DIAGNOSIS — Z0000 Encounter for general adult medical examination without abnormal findings: Secondary | ICD-10-CM | POA: Diagnosis not present

## 2020-08-22 DIAGNOSIS — E559 Vitamin D deficiency, unspecified: Secondary | ICD-10-CM

## 2020-08-22 DIAGNOSIS — F339 Major depressive disorder, recurrent, unspecified: Secondary | ICD-10-CM | POA: Diagnosis not present

## 2020-08-22 DIAGNOSIS — F411 Generalized anxiety disorder: Secondary | ICD-10-CM

## 2020-08-22 DIAGNOSIS — G43109 Migraine with aura, not intractable, without status migrainosus: Secondary | ICD-10-CM

## 2020-08-22 DIAGNOSIS — Z5181 Encounter for therapeutic drug level monitoring: Secondary | ICD-10-CM | POA: Diagnosis not present

## 2020-08-22 MED ORDER — TOPIRAMATE 100 MG PO TABS: 100.0000 mg | ORAL_TABLET | Freq: Every day | ORAL | 3 refills | Status: DC

## 2020-08-23 LAB — COMPREHENSIVE METABOLIC PANEL
ALT: 9 IU/L (ref 0–32)
AST: 12 IU/L (ref 0–40)
Albumin/Globulin Ratio: 2.3 — ABNORMAL HIGH (ref 1.2–2.2)
Albumin: 4.4 g/dL (ref 3.8–4.8)
Alkaline Phosphatase: 56 IU/L (ref 44–121)
BUN/Creatinine Ratio: 18 (ref 9–23)
BUN: 13 mg/dL (ref 6–24)
Bilirubin Total: 0.3 mg/dL (ref 0.0–1.2)
CO2: 19 mmol/L — ABNORMAL LOW (ref 20–29)
Calcium: 8.9 mg/dL (ref 8.7–10.2)
Chloride: 103 mmol/L (ref 96–106)
Creatinine, Ser: 0.74 mg/dL (ref 0.57–1.00)
Globulin, Total: 1.9 g/dL (ref 1.5–4.5)
Glucose: 81 mg/dL (ref 65–99)
Potassium: 4.2 mmol/L (ref 3.5–5.2)
Sodium: 138 mmol/L (ref 134–144)
Total Protein: 6.3 g/dL (ref 6.0–8.5)
eGFR: 101 mL/min/{1.73_m2} (ref >59)

## 2020-08-23 LAB — CBC WITH DIFFERENTIAL/PLATELET
Basophils Absolute: 0 10*3/uL (ref 0.0–0.2)
Basos: 1 %
EOS (ABSOLUTE): 0.1 10*3/uL (ref 0.0–0.4)
Eos: 1 %
Hematocrit: 35.9 % (ref 34.0–46.6)
Hemoglobin: 12.3 g/dL (ref 11.1–15.9)
Immature Grans (Abs): 0 10*3/uL (ref 0.0–0.1)
Immature Granulocytes: 0 %
Lymphocytes Absolute: 2.3 10*3/uL (ref 0.7–3.1)
Lymphs: 45 %
MCH: 31.2 pg (ref 26.6–33.0)
MCHC: 34.3 g/dL (ref 31.5–35.7)
MCV: 91 fL (ref 79–97)
Monocytes Absolute: 0.6 10*3/uL (ref 0.1–0.9)
Monocytes: 12 %
Neutrophils Absolute: 2.1 10*3/uL (ref 1.4–7.0)
Neutrophils: 41 %
Platelets: 236 10*3/uL (ref 150–450)
RBC: 3.94 x10E6/uL (ref 3.77–5.28)
RDW: 12.2 % (ref 11.7–15.4)
WBC: 5.1 10*3/uL (ref 3.4–10.8)

## 2020-08-23 LAB — LIPID PANEL
Chol/HDL Ratio: 2.8 ratio (ref 0.0–4.4)
Cholesterol, Total: 204 mg/dL — ABNORMAL HIGH (ref 100–199)
HDL: 74 mg/dL (ref >39)
LDL Chol Calc (NIH): 116 mg/dL — ABNORMAL HIGH (ref 0–99)
Triglycerides: 79 mg/dL (ref 0–149)
VLDL Cholesterol Cal: 14 mg/dL (ref 5–40)

## 2020-08-23 LAB — VITAMIN D 25 HYDROXY (VIT D DEFICIENCY, FRACTURES): Vit D, 25-Hydroxy: 18.4 ng/mL — ABNORMAL LOW (ref 30.0–100.0)

## 2020-08-23 MED ORDER — VITAMIN D (ERGOCALCIFEROL) 1.25 MG (50000 UNIT) PO CAPS
50000.0000 [IU] | ORAL_CAPSULE | ORAL | 0 refills | Status: AC
Start: 2020-08-23 — End: ?

## 2020-08-24 ENCOUNTER — Encounter: Payer: Self-pay | Admitting: Family Medicine

## 2020-09-10 ENCOUNTER — Other Ambulatory Visit: Payer: Self-pay

## 2020-09-10 ENCOUNTER — Telehealth: Payer: Self-pay | Admitting: *Deleted

## 2020-09-10 ENCOUNTER — Other Ambulatory Visit: Payer: Self-pay | Admitting: *Deleted

## 2020-09-10 ENCOUNTER — Other Ambulatory Visit: Payer: No Typology Code available for payment source

## 2020-09-10 DIAGNOSIS — Z111 Encounter for screening for respiratory tuberculosis: Secondary | ICD-10-CM

## 2020-09-10 NOTE — Telephone Encounter (Signed)
That's fine

## 2020-09-10 NOTE — Telephone Encounter (Signed)
Patient advised, future order entered and appt scheduled.

## 2020-09-10 NOTE — Telephone Encounter (Signed)
Patient called and asked if she could come for a [employer] today at 3:00 for a new job? I told her I would call her back and let her know. Results would go to her and she would handle. Thanks.

## 2020-09-12 LAB — QUANTIFERON-TB GOLD PLUS
QuantiFERON Mitogen Value: >10 IU/mL
QuantiFERON Nil Value: 0.04 IU/mL
QuantiFERON TB1 Ag Value: 0.04 IU/mL
QuantiFERON TB2 Ag Value: 0.07 IU/mL
QuantiFERON-TB Gold Plus: NEGATIVE

## 2020-11-28 ENCOUNTER — Encounter (HOSPITAL_COMMUNITY): Payer: Self-pay

## 2020-11-29 ENCOUNTER — Other Ambulatory Visit: Payer: Self-pay | Admitting: Family Medicine

## 2020-11-29 DIAGNOSIS — Z1231 Encounter for screening mammogram for malignant neoplasm of breast: Secondary | ICD-10-CM

## 2020-12-03 ENCOUNTER — Ambulatory Visit
Admission: RE | Admit: 2020-12-03 | Discharge: 2020-12-03 | Disposition: A | Discharge status: Home / Self Care | Payer: No Typology Code available for payment source | Source: Ambulatory Visit

## 2020-12-03 ENCOUNTER — Other Ambulatory Visit: Payer: Self-pay

## 2020-12-03 DIAGNOSIS — Z1231 Encounter for screening mammogram for malignant neoplasm of breast: Secondary | ICD-10-CM

## 2021-02-25 ENCOUNTER — Other Ambulatory Visit: Payer: Self-pay | Admitting: Family Medicine

## 2021-02-25 DIAGNOSIS — F331 Major depressive disorder, recurrent, moderate: Secondary | ICD-10-CM

## 2021-02-25 NOTE — Telephone Encounter (Signed)
Is this okay to refill? 

## 2021-03-21 ENCOUNTER — Encounter (INDEPENDENT_AMBULATORY_CARE_PROVIDER_SITE_OTHER): Payer: Self-pay

## 2021-06-24 ENCOUNTER — Other Ambulatory Visit: Payer: Self-pay | Admitting: Cardiac Rehabilitation

## 2021-06-24 DIAGNOSIS — M25512 Pain in left shoulder: Secondary | ICD-10-CM

## 2021-06-24 DIAGNOSIS — G8929 Other chronic pain: Secondary | ICD-10-CM

## 2021-07-04 ENCOUNTER — Ambulatory Visit
Admission: RE | Admit: 2021-07-04 | Discharge: 2021-07-04 | Disposition: A | Discharge status: Home / Self Care | Payer: Commercial Managed Care - PPO | Source: Ambulatory Visit | Attending: Cardiac Rehabilitation | Admitting: Cardiac Rehabilitation

## 2021-07-04 DIAGNOSIS — G8929 Other chronic pain: Secondary | ICD-10-CM

## 2021-07-04 DIAGNOSIS — M25512 Pain in left shoulder: Secondary | ICD-10-CM

## 2021-07-17 IMAGING — MG MM BREAST BX W/ LOC DEV 1ST LESION IMAGE BX SPEC STEREO GUIDE*R*
8 of 14 series · 8 of 22 positions shown · non-contrast
Comparison: Previous exam(s).
COMPARISON: Previous exam(s).

Addendum:
CLINICAL DATA: 43-year-old female presenting for stereotactic
biopsy of right breast calcifications.

EXAM:
ULTRASOUND GUIDED RIGHT BREAST CORE NEEDLE BIOPSY

[R (1 of 8)]
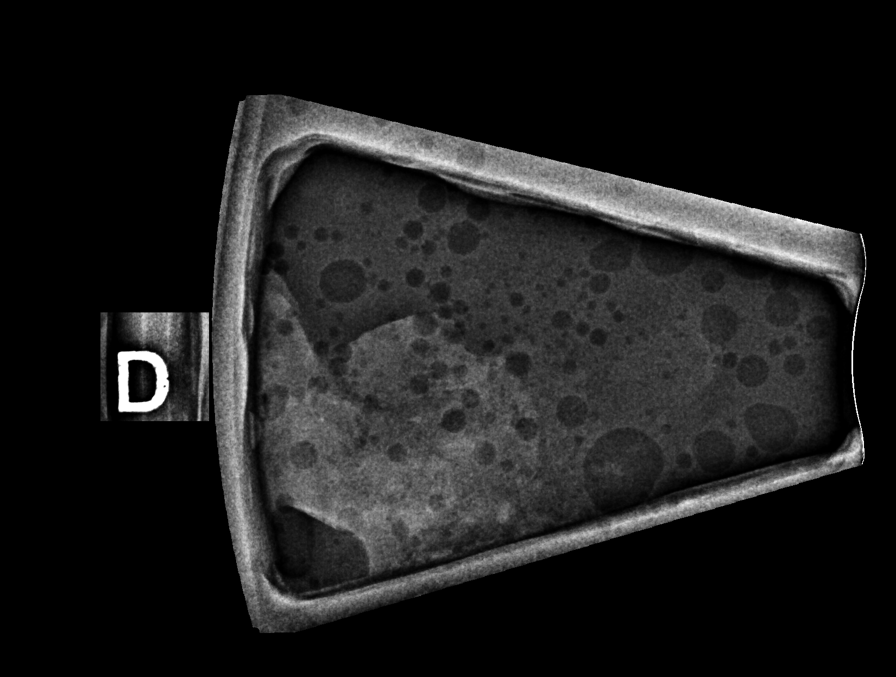

[R (2 of 8)]
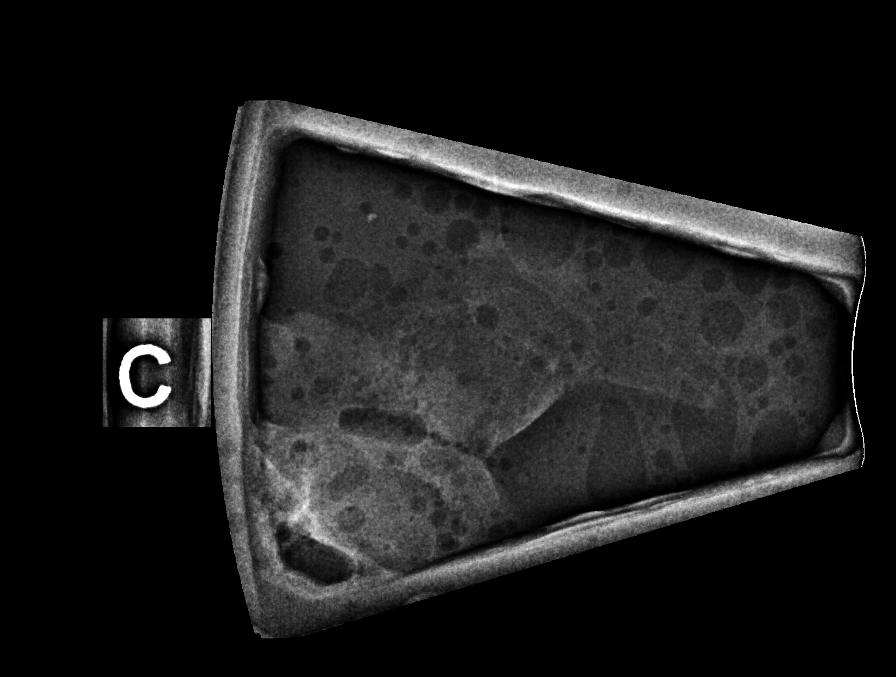

[R (3 of 8)]
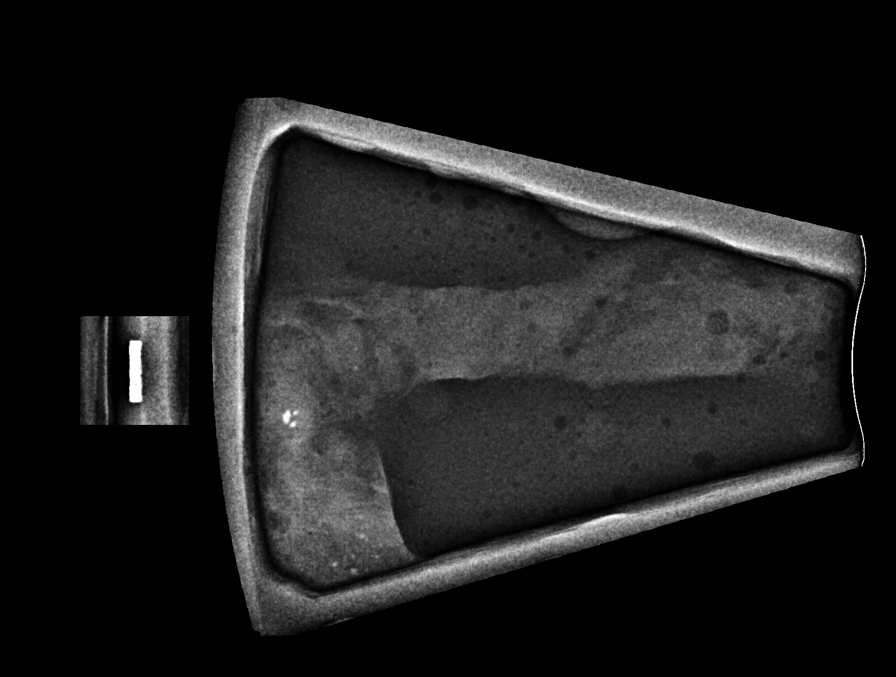

[R (4 of 8)]
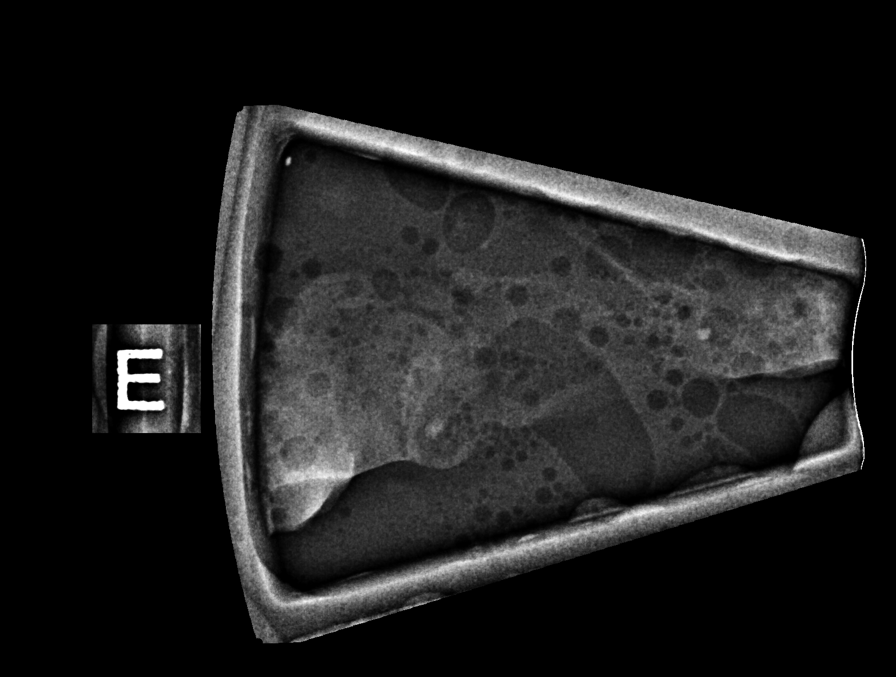

[R (5 of 8)]
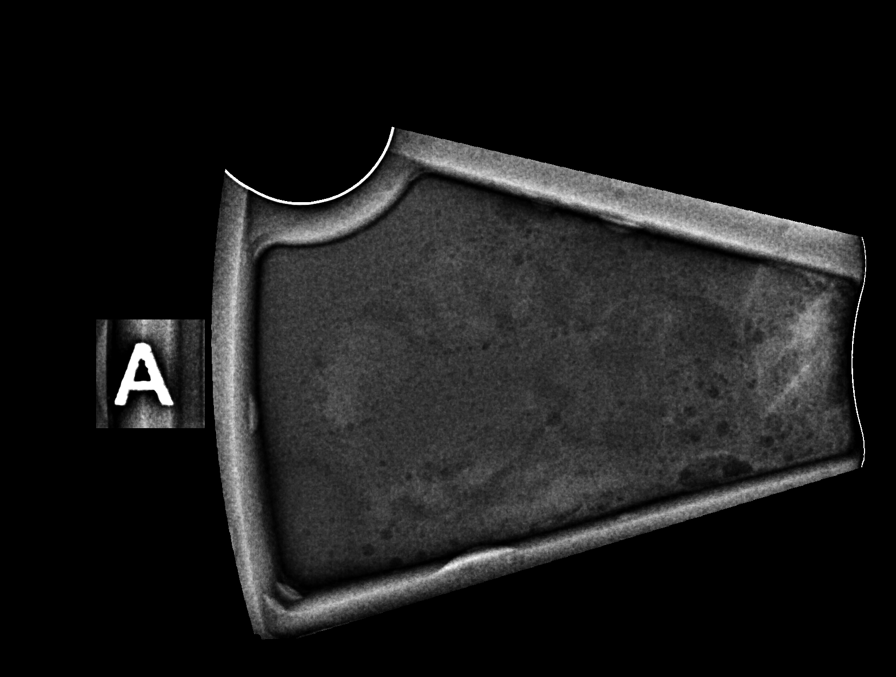

[R (6 of 8)]
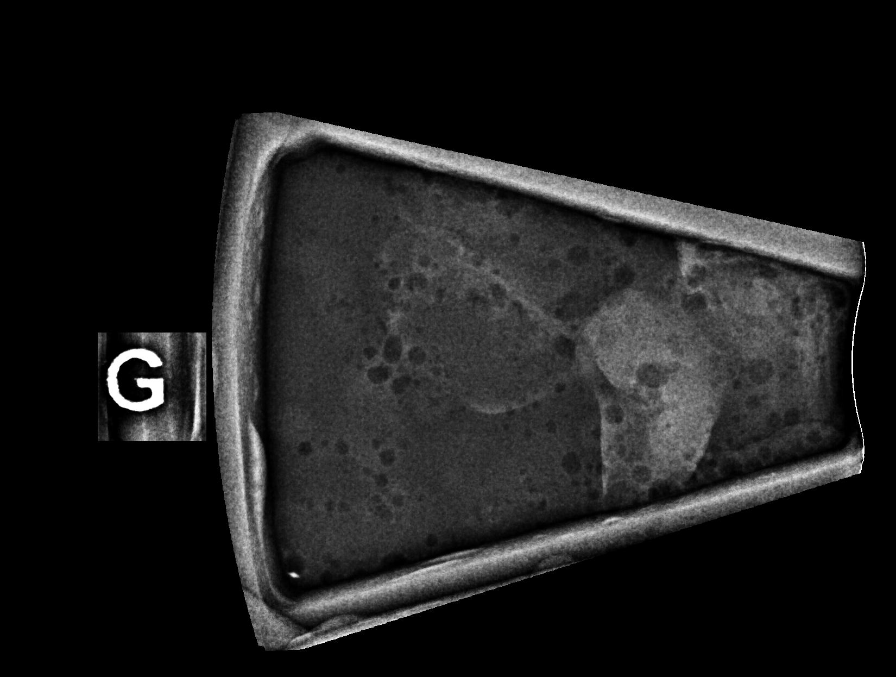

[R (7 of 8)]
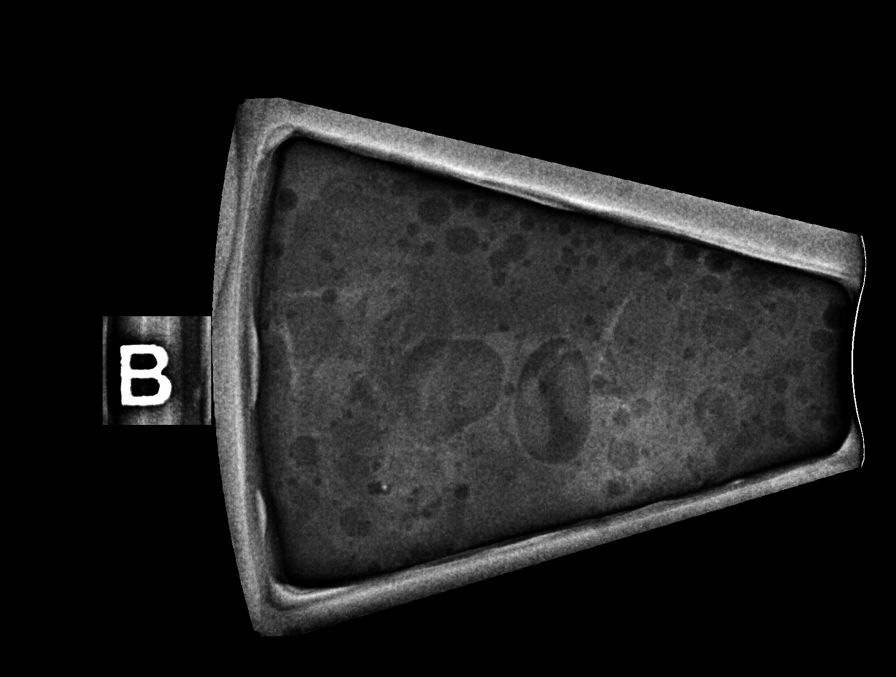

[R (8 of 8)]
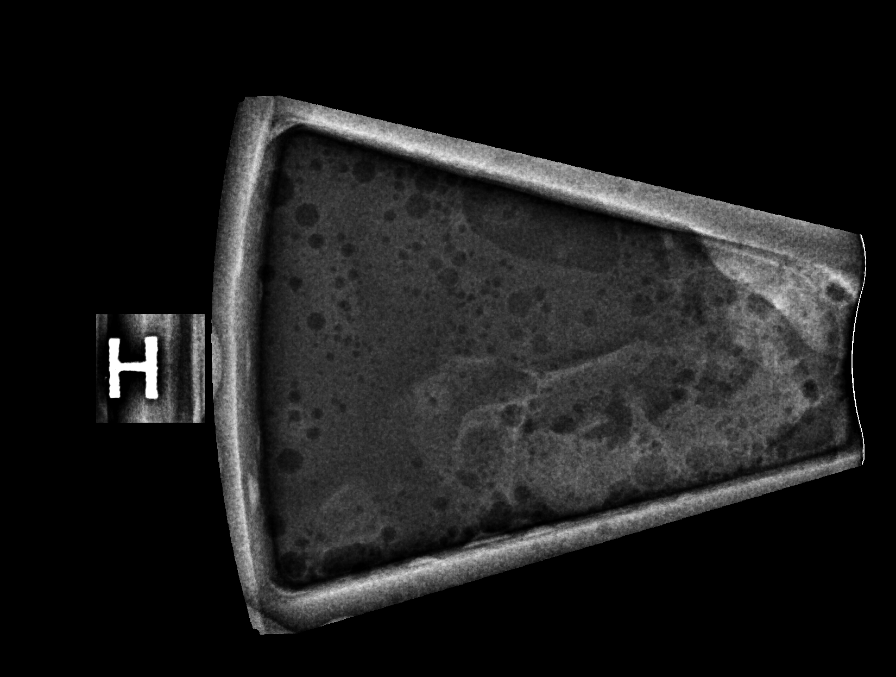

[8 of 22 positions shown; findings below may reference images not displayed]



Lesion quadrant: Upper outer quadrant

Using sterile technique and 1% Lidocaine as local anesthetic, under
direct ultrasound visualization, a 14 gauge Liriano device was
used to perform biopsy of calcifications in the lateral right breast
using a superior approach. At the conclusion of the procedure ribbon
shaped tissue marker clip was deployed into the biopsy cavity.
Follow up 2 view mammogram was performed and dictated separately.
IMPRESSION: Ultrasound guided biopsy of calcifications in the lateral right
breast. No apparent complications.

ADDENDUM:
Pathology revealed FIBROCYSTIC CHANGE AND USUAL DUCTAL HYPERPLASIA
WITH CALCIFICATIONS, PERIDUCTAL CHRONIC INFLAMMATION, BIOPSY SITE
CHANGES of the RIGHT breast, lateral. This was found to be
concordant by Dr. Iteshi Ofana.

Pathology results were discussed with the patient by telephone. The
patient reported doing well after the biopsy with tenderness at the
site. Post biopsy instructions and care were reviewed and questions
were answered. The patient was encouraged to call The [REDACTED]

Patient is scheduled for surgical consultation with Dr. Masod
Ceejay at [REDACTED] on October 21, 2019 for RIGHT
breast biopsy proven complex sclerosing lesion.

Pathology results reported by Ingunn Harpa Ronlor RN on 10/11/2019.



Lesion quadrant: Upper outer quadrant

Using sterile technique and 1% Lidocaine as local anesthetic, under
direct ultrasound visualization, a 14 gauge Liriano device was
used to perform biopsy of calcifications in the lateral right breast
using a superior approach. At the conclusion of the procedure ribbon
shaped tissue marker clip was deployed into the biopsy cavity.
Follow up 2 view mammogram was performed and dictated separately.
IMPRESSION: Ultrasound guided biopsy of calcifications in the lateral right
breast. No apparent complications.

## 2021-07-17 IMAGING — MG MM BREAST LOCALIZATION CLIP
4 series · 4 of 12 positions shown · non-contrast
Comparison: Previous exam(s).

CLINICAL DATA: Post biopsy mammogram of the right breast for clip
placement.

EXAM:
DIAGNOSTIC RIGHT MAMMOGRAM POST STEREOTACTIC BIOPSY

[R CC synth-2D]
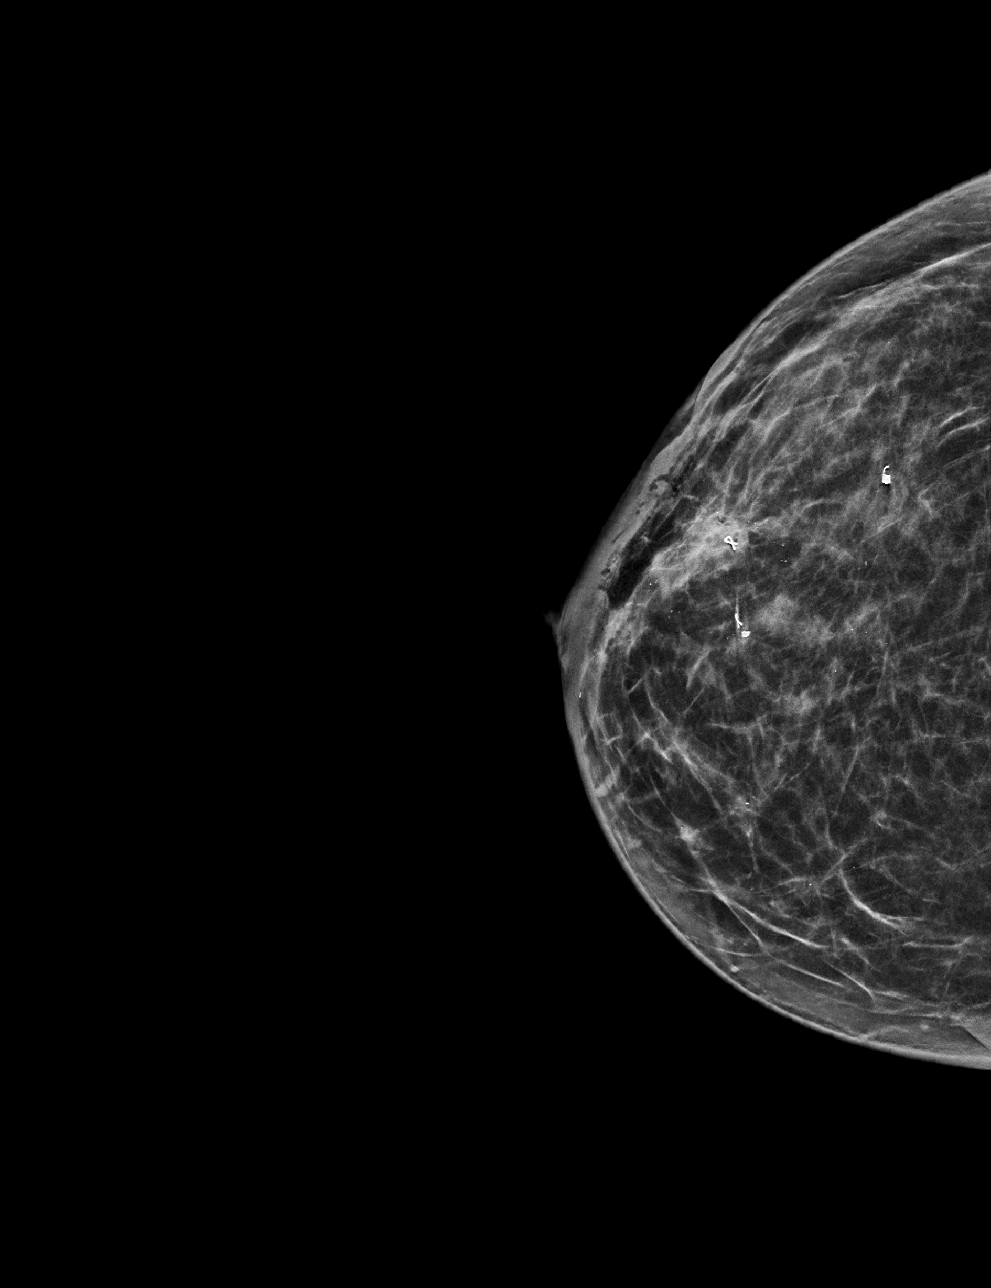

[R ML synth-2D]
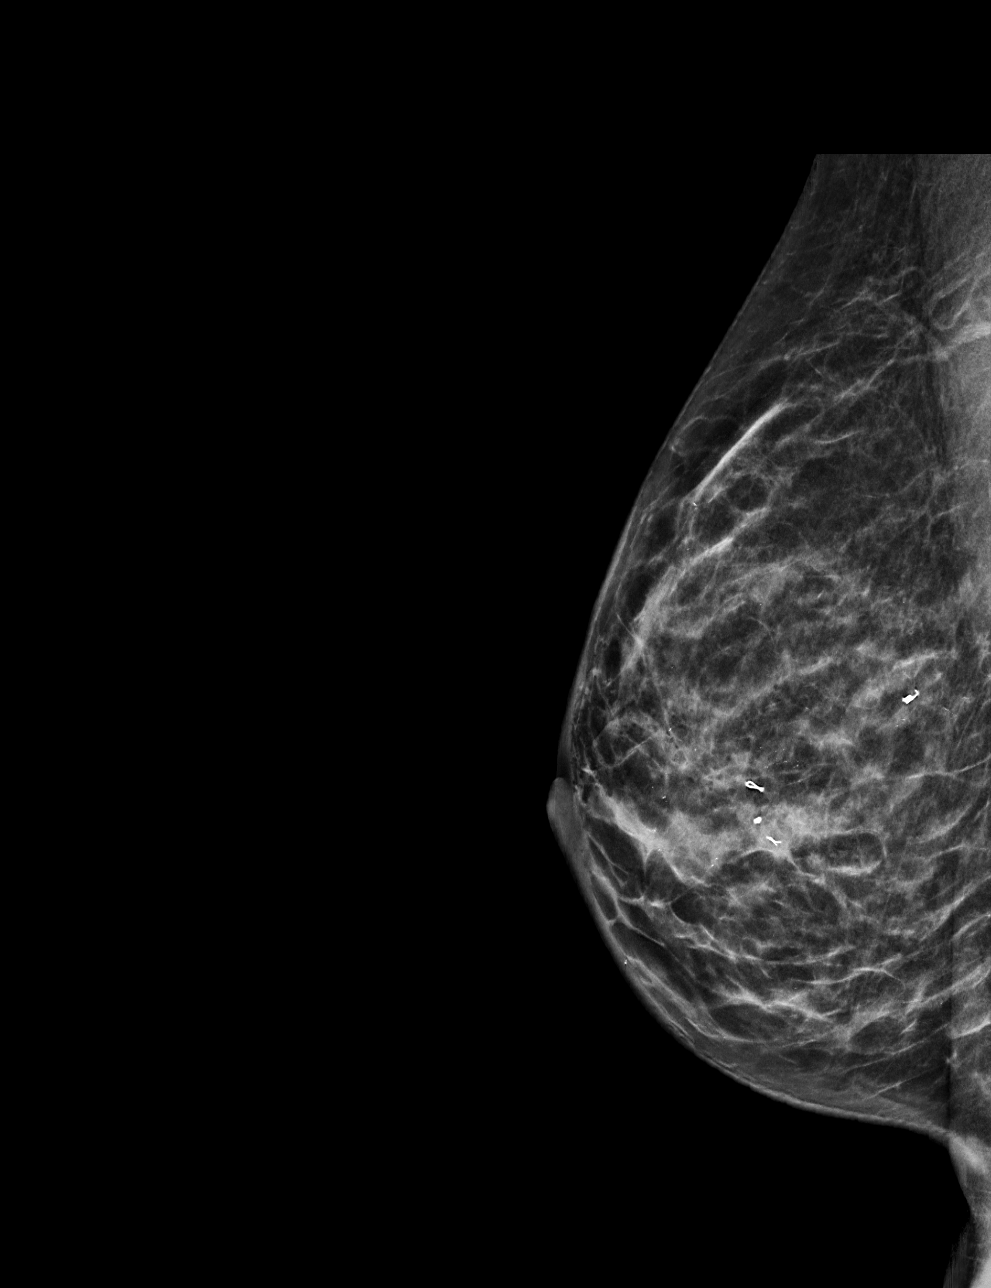

[R CC tomo · tomo slice 28/55.0]
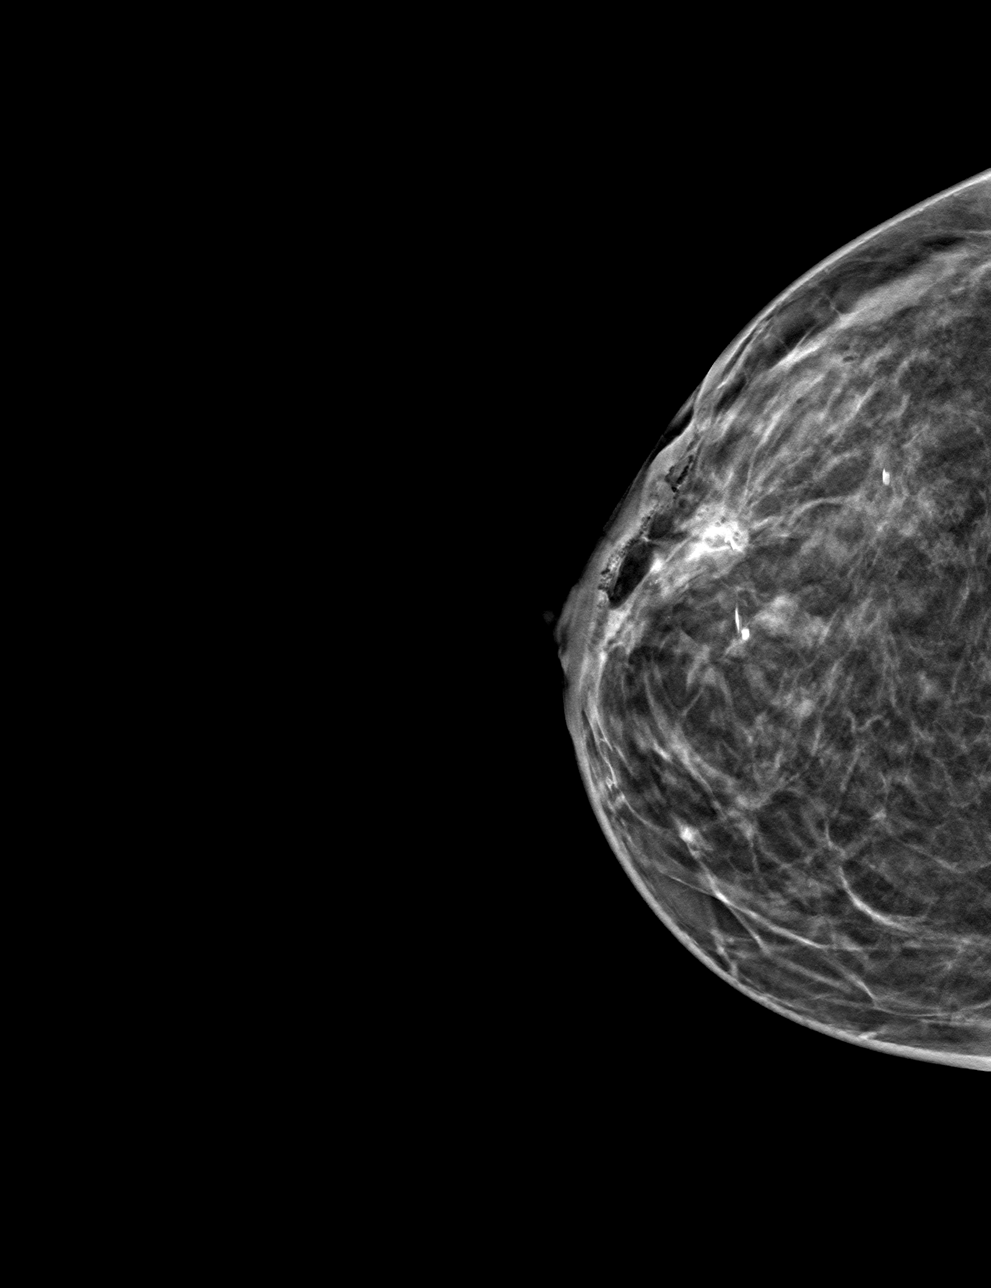

[R ML tomo · tomo slice 27/52.0]
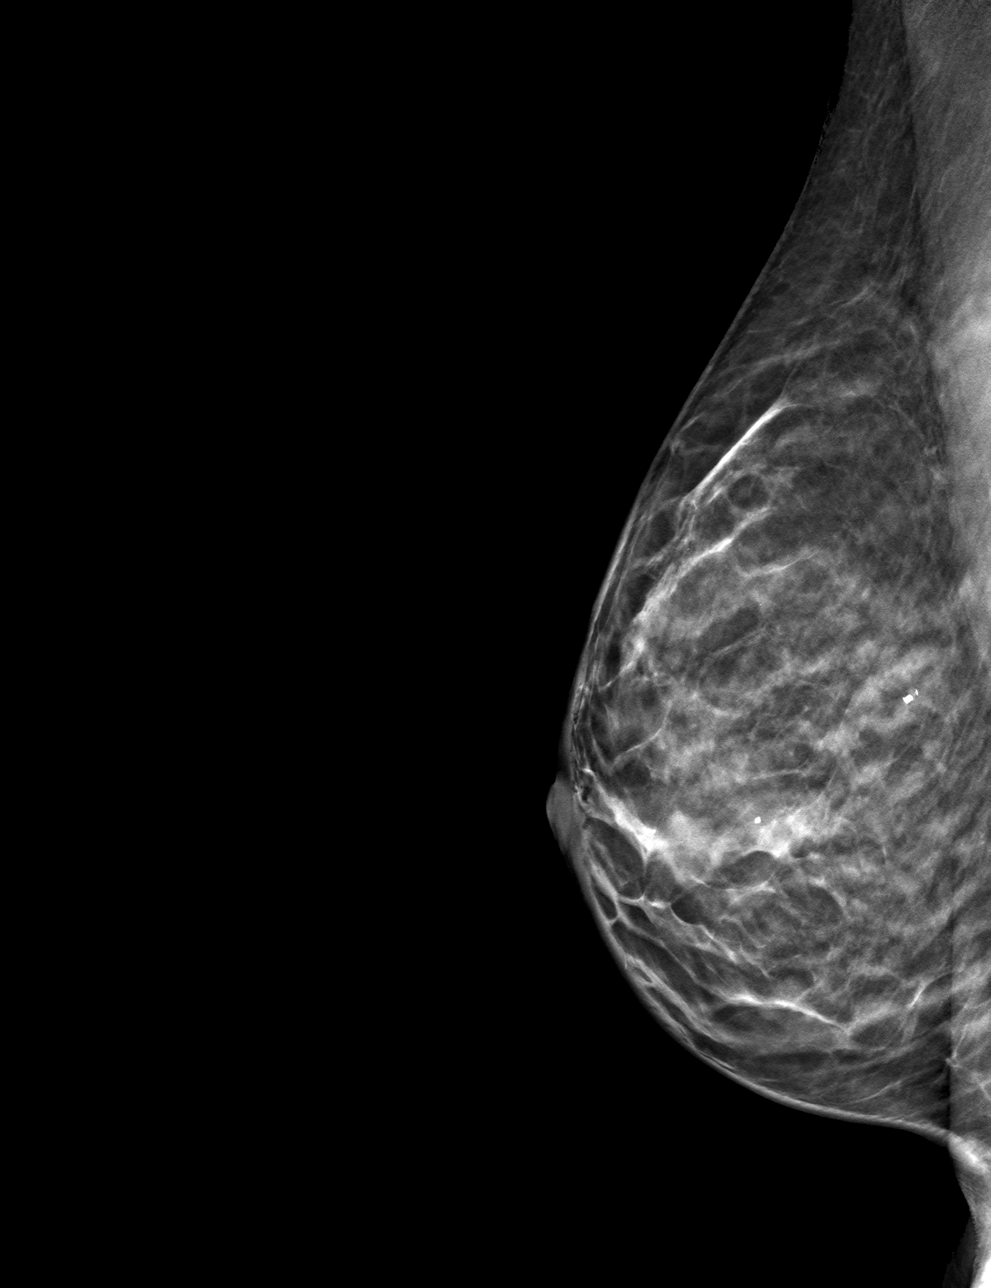

[4 of 12 positions shown; findings below may reference images not displayed]

FINDINGS: Mammographic images were obtained following stereotactic guided
biopsy of calcifications in the lateral right breast. The biopsy
marking clip is in expected position at the site of biopsy.
IMPRESSION: Appropriate positioning of the ribbon shaped biopsy marking clip at
the site of biopsy in the lateral right breast.

Final Assessment: Post Procedure Mammograms for Marker Placement

## 2021-08-25 NOTE — Progress Notes (Signed)
?Chief Complaint  ?Patient presents with  ? Annual Exam  ?  Nonfasting annual exam, no pap. She has not has another pap since 2017.   ? ? ?Megan Butler is a 46 y.o. female who presents for a complete physical. She sees Dr. Nelda Butler for GYN.  Dr. Nelda Butler has left [place], due for visit. ? ?She has been seeing Megan Butler (psych NP in [facility]), diagnosed with ADHD. Doing well on dextroamphetamine (Dexedrine) 27m daily (don't make that dose, so has separate 10 and 141mextended release med).  Takes short-acting 1023mn the evening (3pm) when she has the kids. ?Had SE from adderall.  Focus is much better.  Denies SE. If she takes the 42m20mse after 3pm, she will have trouble sleeping. ? ?Depression and anxiety:   She ran out of Trintellix, had some recurrent symptoms. ?She had leftover wellbutrin, didn't do well on that, had to restart Trintellix.  She has been out of Rx, but is taking samples. She denies side effects. It is working well for her. ?She enjoys her job (8-4 M-F), but is very busy, has a lot of charting in the evenings. ?Her divorce from JoshMerrily Butler final 2 weeks ago, but amicable, especially with respect to custody of [employer] Butler changes in dealing with 1st ex ([name] progress with custody. ?Finances are no longer a problem. ? ?Migraines:  These were doing great when she hadn't had cycles for a long time.  Cycles are irregular, gets migraines related to them. UbreRoselyn Meierains effective for her migraines. ?She continues to take topamax 100mg24mly for prevention. She denies side effects.  ?(previously did well with Midrin prn, but no longer available; fioricet wasn't helpful; Nurtec samples had also been helpful in past). ? ?Vitamin D deficiency:  Last level was 18.4 in 08/2020.  She hadn't been taking any D supplement for months prior to that check. She was prescribed with 12 weeks of prescription therapy last year, never took it.  Isn't currently taking any D3, just getting some more sun exposure  (bought a convertible). ?  ? ?Borderline hyperlipidemia noted in 07/2019, with LDL 151.  Recheck last year was improved, LDL 116. ?Diet: red meat once/week, +cheese, infrequent eggs. ?Overall eats well.  She isn't fasting today. ? ?Lab Results  ?Component Value Date  ? CHOL 204 (H) 08/22/2020  ? HDL 74 08/22/2020  ? LDLCALC 116 (H) 08/22/2020  ? TRIG 79 08/22/2020  ? CHOLHDL 2.8 08/22/2020  ? ? ? ?Immunization History  ?Administered Date(s) Administered  ? Influenza Split 02/10/2012, 03/02/2013  ? Influenza,inj,Quad PF,6+ Mos 02/13/2015, 01/28/2018, 03/27/2019  ? Influenza-Unspecified 02/11/2016, 03/10/2017, 02/09/2020, 03/08/2021  ? PFIZER(Purple Top)SARS-COV-2 Vaccination 01/11/2019, 02/01/2019, 02/22/2020  ? PfizePension scheme managers25yr 04/11/2021  ? Tdap 01/11/2016  ? ?+HepB titers in the past ?Last Pap smear: 04/2016, normal, no high risk HPV.  ?Last mammogram: 11/2020 ?Last colonoscopy: never, never returned Cologuard (related to frequent menstrual spotting at that time), Asking for another order ?Last DEXA: never ?Dentist:  at least once a year ?Ophtho: up to date.  Wears contacts ?Exercise:  No regular exercise, busy charting at night. ?has stair stepper, treadmill, plus a [employer] membership (hasn't been exercising there, bringing kids). ?Has resistance bands at home. ? 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 ?Tiny amount of fluid in the subacromial/subdeltoid bursa suggestive ?of bursitis. ? ?PMH, PSH, SH and FH  were reviewed and updated ? ?Outpatient Encounter Medications as of 08/26/2021  ?Medication Sig Note  ? dextroamphetamine (DEXEDRINE SPANSULE) 10 MG 24 hr capsule Take 10 mg by mouth every morning.   ? dextroamphetamine (DEXEDRINE SPANSULE) 15 MG 24 hr capsule Take 15 mg by mouth every morning.   ? Melatonin 5 MG CHEW Chew 1 tablet by mouth as needed. 08/26/2021: Took last night   ? topiramate (TOPAMAX) 100 MG tablet Take 1 tablet (100 mg total) by mouth daily.   ? vortioxetine HBr (TRINTELLIX) 10 MG TABS tablet TAKE 1 TABLET BY MOUTH EVERY DAY   ? ALPRAZolam (XANAX) 0.5 MG tablet Take 0.5-1 tablets (0.25-0.5 mg total) by mouth 3 (three) times daily as needed for anxiety. (Patient not taking: Reported on 08/22/2020) 08/26/2021: prn  ? dextroamphetamine (DEXTROSTAT) 10 MG tablet Take 10 mg by mouth daily. (Patient not taking: Reported on 08/26/2021) 08/26/2021: As needed, in evenings  ? ibuprofen (ADVIL) 200 MG tablet Take 400-800 mg by mouth every 6 (six) hours as needed for moderate pain. (Patient not taking: Reported on 02/08/2020) 08/26/2021: prn  ? meloxicam (MOBIC) 15 MG tablet Take 15 mg by mouth daily. (Patient not taking: Reported on 08/26/2021) 08/26/2021: As needed ?  ? Rimegepant Sulfate (NURTEC) 75 MG TBDP Take 75 mg by mouth daily as needed (migraine headache). (Patient not taking: Reported on 08/22/2020)   ? Ubrogepant (UBRELVY) 100 MG TABS Take 100 mg by mouth daily as needed (migraine headache). (Patient not taking: Reported on 08/26/2021) 08/26/2021: prn  ? Vitamin D, Ergocalciferol, (DRISDOL) 1.25 MG (50000 UNIT) CAPS capsule Take 1 capsule (50,000 Units total) by mouth every 7 (seven) days. (Patient not taking: Reported on 08/26/2021) 08/26/2021: Never picked up  ? [DISCONTINUED] permethrin (ELIMITE) 5 % cream SMARTSIG:2 Topical Twice Daily   ? ?No facility-administered encounter medications on file as of 08/26/2021.  ? ?Allergies  ?Allergen Reactions  ? Penicillins Other (See Comments)  ?  Syncope ?Has patient had a PCN reaction causing immediate rash, facial/tongue/throat swelling, SOB or lightheadedness with hypotension: No ?Has patient had a PCN reaction causing severe rash involving mucus membranes or skin necrosis: No ?Has patient had a PCN reaction that required hospitalization No ?Has patient had a PCN reaction occurring within the last 10 years: No ?If all of the above answers  are "NO", then may proceed with Cephalosporin use. ?  ? Sulfa Antibiotics Rash  ? ?ROS:  The patient denies anorexia, fever, vision changes, decreased hearing, ear pain, sore throat, breast concerns, chest pain, palpitations, dizziness, syncope, dyspnea on exertion, cough, swelling, nausea, vomiting, diarrhea, constipation, abdominal pain, melena, hematochezia, indigestion/heartburn, hematuria, incontinence, dysuria, vaginal discharge, odor or itch, genital lesions, joint pains, numbness, tingling, weakness, tremor, suspicious skin lesions, abnormal bleeding/bruising, or enlarged lymph nodes. ?Migraines, only related to cycles. ?Moods are good. Focus is better since on ADHD meds. ?Some insomnia, uses melatonin, which is effective in getting her to sleep. Only uses it occasionally. ?Cycles are irregular, not usually heavy. No hot flashes. ?Slight cough.  Denies allergy symptoms. ?L shoulder pain per HPI. ? ? ?PHYSICAL EXAM: ? ?BP 108/72   Pulse 67   Ht 5' 5"  (1.651 m)   Wt 145 lb 3.2 oz (65.9 kg)   LMP 08/09/2021   BMI 24.16 kg/m?  ? ?Wt Readings from Last 3 Encounters:  ?08/26/21 145 lb 3.2 oz (65.9 kg)  ?08/22/20 138 lb 9.6 oz (62.9 kg)  ?02/08/20 129 lb (58.5 kg)  ? ? ?General Appearance:    Alert,  cooperative, no distress, appears stated age.   ?Head:    Normocephalic, without obvious abnormality, atraumatic  ?Eyes:    PERRL, conjunctiva/corneas clear, EOM's intact, fundi benign  ?Ears:    Normal TM's and external ear canals  ?Nose:   Normal, mild edema R>L, no erythema, no purulence. Sinuses nontender  ?Throat:   Normal mucosa, no lesions  ?Neck:   Supple, no lymphadenopathy;  thyroid:  no enlargement/ tenderness/nodules; no carotid bruit or JVD  ?Back:    Spine nontender, no curvature, ROM normal, no CVA     tenderness  ?Lungs:     Clear to auscultation bilaterally without wheezes, rales or     ronchi; respirations unlabored  ?Chest Wall:    No tenderness or deformity  ? Heart:    Regular rate and rhythm,  S1 and S2 normal, no murmur, rub ?or gallop  ?Breast Exam:    Deferred to GYN  ?Abdomen:     Soft, non-tender, nondistended, normoactive bowel sounds,  ?no masses, no hepatosplenomegaly  ?Genitalia:    Deferred

## 2021-08-25 NOTE — Patient Instructions (Signed)
?  HEALTH MAINTENANCE RECOMMENDATIONS: ? ?It is recommended that you get at least 30 minutes of aerobic exercise at least 5 days/week (for weight loss, you may need as much as 60-90 minutes). This can be any activity that gets your heart rate up. This can be divided in 10-15 minute intervals if needed, but try and build up your endurance at least once a week.  Weight bearing exercise is also recommended twice weekly. ? ?Eat a healthy diet with lots of vegetables, fruits and fiber.  "Colorful" foods have a lot of vitamins (ie green vegetables, tomatoes, red peppers, etc).  Limit sweet tea, regular sodas and alcoholic beverages, all of which has a lot of calories and sugar.  Up to 1 alcoholic drink daily may be beneficial for women (unless trying to lose weight, watch sugars).  Drink a lot of water. ? ?Calcium recommendations are 1200-1500 mg daily (1500 mg for postmenopausal women or women without ovaries), and vitamin D 1000 IU daily.  This should be obtained from diet and/or supplements (vitamins), and calcium should not be taken all at once, but in divided doses. ? ?Monthly self breast exams and yearly mammograms for women over the age of 62 is recommended. ? ?Sunscreen of at least SPF 30 should be used on all sun-exposed parts of the skin when outside between the hours of 10 am and 4 pm (not just when at beach or pool, but even with exercise, golf, tennis, and yard work!)  Use a sunscreen that says "broad spectrum" so it covers both UVA and UVB rays, and make sure to reapply every 1-2 hours. ? ?Remember to change the batteries in your smoke detectors when changing your clock times in the spring and fall. Carbon monoxide detectors are recommended for your home. ? ?Use your seat belt every time you are in a car, and please drive safely and not be distracted with cell phones and texting while driving. ? ? ?

## 2021-08-26 ENCOUNTER — Telehealth: Payer: Self-pay | Admitting: Internal Medicine

## 2021-08-26 ENCOUNTER — Encounter: Payer: Self-pay | Admitting: Family Medicine

## 2021-08-26 ENCOUNTER — Ambulatory Visit (INDEPENDENT_AMBULATORY_CARE_PROVIDER_SITE_OTHER): Payer: Commercial Managed Care - PPO | Admitting: Family Medicine

## 2021-08-26 VITALS — BP 108/72 | HR 67 | Ht 65.0 in | Wt 145.2 lb

## 2021-08-26 DIAGNOSIS — E559 Vitamin D deficiency, unspecified: Secondary | ICD-10-CM | POA: Diagnosis not present

## 2021-08-26 DIAGNOSIS — G43109 Migraine with aura, not intractable, without status migrainosus: Secondary | ICD-10-CM | POA: Diagnosis not present

## 2021-08-26 DIAGNOSIS — F411 Generalized anxiety disorder: Secondary | ICD-10-CM

## 2021-08-26 DIAGNOSIS — Z0000 Encounter for general adult medical examination without abnormal findings: Secondary | ICD-10-CM | POA: Diagnosis not present

## 2021-08-26 DIAGNOSIS — F331 Major depressive disorder, recurrent, moderate: Secondary | ICD-10-CM

## 2021-08-26 DIAGNOSIS — Z1159 Encounter for screening for other viral diseases: Secondary | ICD-10-CM

## 2021-08-26 DIAGNOSIS — Z1211 Encounter for screening for malignant neoplasm of colon: Secondary | ICD-10-CM

## 2021-08-26 DIAGNOSIS — F339 Major depressive disorder, recurrent, unspecified: Secondary | ICD-10-CM

## 2021-08-26 DIAGNOSIS — F909 Attention-deficit hyperactivity disorder, unspecified type: Secondary | ICD-10-CM

## 2021-08-26 DIAGNOSIS — F3340 Major depressive disorder, recurrent, in remission, unspecified: Secondary | ICD-10-CM

## 2021-08-26 MED ORDER — TOPIRAMATE 100 MG PO TABS: 100.0000 mg | ORAL_TABLET | Freq: Every day | ORAL | 3 refills | Status: AC

## 2021-08-26 MED ORDER — VORTIOXETINE HBR 10 MG PO TABS: 10.0000 mg | ORAL_TABLET | Freq: Every day | ORAL | 3 refills | Status: AC

## 2021-08-26 NOTE — Telephone Encounter (Signed)
P.A Trintellix 32m needed ? ?Covered Formulary alternative include: ? ?Citaopram ?Duloxetine ?Fluoxetine ?Paroxetine ?Sertraline ?Venlafaxine ER ?Vilazodone HCl ? ?Please advise if you want to move forward with PA Trintellix or send in one of the covered medications. ([place]  ?

## 2021-08-26 NOTE — Telephone Encounter (Signed)
She has been on Trintellix and doing well for a while.  She previously took cymbalta, and other SSRI's and didn't do well, which is why she was changed to Trintellix . You can check back in her chart (from years ago) to see which specific SSRI's she prev took (also had taken wellbutrin). ?Advise pt that we are waiting on PA--I believe she has been using samples.  Have her contact us if more samples are needed ?

## 2021-08-28 NOTE — Telephone Encounter (Signed)
P.A. TRINTELLIX completed, sent pt mychart message

## 2021-09-02 NOTE — Telephone Encounter (Signed)
P.A, approved til 08/29/22, left message for pt

## 2021-10-19 LAB — COLOGUARD: COLOGUARD: NEGATIVE

## 2022-01-15 ENCOUNTER — Encounter: Payer: Self-pay | Admitting: Internal Medicine

## 2022-08-28 ENCOUNTER — Encounter: Payer: No Typology Code available for payment source | Admitting: Family Medicine

## 2023-09-01 ENCOUNTER — Other Ambulatory Visit: Payer: Self-pay | Admitting: Family Medicine

## 2023-09-01 DIAGNOSIS — Z1231 Encounter for screening mammogram for malignant neoplasm of breast: Secondary | ICD-10-CM

## 2023-09-03 ENCOUNTER — Ambulatory Visit
Admission: RE | Admit: 2023-09-03 | Discharge: 2023-09-03 | Discharge status: Home / Self Care | Source: Ambulatory Visit

## 2023-09-03 DIAGNOSIS — Z1231 Encounter for screening mammogram for malignant neoplasm of breast: Secondary | ICD-10-CM

## 2024-06-17 ENCOUNTER — Other Ambulatory Visit: Payer: Self-pay | Admitting: Medical Genetics
